# Patient Record
Sex: Female | Born: 1985 | Race: Black or African American | Hispanic: No | Marital: Single | Smoking: Former smoker
Health system: Southern US, Community
[De-identification: ages and names within clinical notes are randomized; demographics above are authoritative.]

## PROBLEM LIST (undated history)

## (undated) HISTORY — PX: NO PAST SURGERIES: SHX2092

---

## 2017-02-27 ENCOUNTER — Encounter (HOSPITAL_COMMUNITY): Payer: Self-pay | Admitting: Emergency Medicine

## 2017-02-27 ENCOUNTER — Emergency Department (HOSPITAL_COMMUNITY)
Admission: EM | Admit: 2017-02-27 | Discharge: 2017-02-27 | Disposition: A | Discharge status: Home / Self Care | Payer: Self-pay | Attending: Emergency Medicine | Admitting: Emergency Medicine

## 2017-02-27 DIAGNOSIS — N750 Cyst of Bartholin's gland: Secondary | ICD-10-CM | POA: Insufficient documentation

## 2017-02-27 DIAGNOSIS — F17200 Nicotine dependence, unspecified, uncomplicated: Secondary | ICD-10-CM | POA: Insufficient documentation

## 2017-02-27 DIAGNOSIS — R102 Pelvic and perineal pain: Secondary | ICD-10-CM | POA: Insufficient documentation

## 2017-02-27 LAB — URINALYSIS, ROUTINE W REFLEX MICROSCOPIC
Bacteria, UA: NONE SEEN
Bilirubin Urine: NEGATIVE
Glucose, UA: NEGATIVE mg/dL
Ketones, ur: NEGATIVE mg/dL
Leukocytes, UA: NEGATIVE
Nitrite: NEGATIVE
Protein, ur: NEGATIVE mg/dL
Specific Gravity, Urine: 1.028 (ref 1.005–1.030)
pH: 6 (ref 5.0–8.0)

## 2017-02-27 LAB — CBC
HCT: 39.2 % (ref 36.0–46.0)
Hemoglobin: 12.8 g/dL (ref 12.0–15.0)
MCH: 31.3 pg (ref 26.0–34.0)
MCHC: 32.7 g/dL (ref 30.0–36.0)
MCV: 95.8 fL (ref 78.0–100.0)
Platelets: 234 10*3/uL (ref 150–400)
RBC: 4.09 MIL/uL (ref 3.87–5.11)
RDW: 13.3 % (ref 11.5–15.5)
WBC: 9.9 10*3/uL (ref 4.0–10.5)

## 2017-02-27 LAB — BASIC METABOLIC PANEL
Anion gap: 7 (ref 5–15)
BUN: 14 mg/dL (ref 6–20)
CO2: 25 mmol/L (ref 22–32)
Calcium: 8.5 mg/dL — ABNORMAL LOW (ref 8.9–10.3)
Chloride: 102 mmol/L (ref 101–111)
Creatinine, Ser: 0.82 mg/dL (ref 0.44–1.00)
GFR calc Af Amer: >60 mL/min (ref >60)
GFR calc non Af Amer: >60 mL/min (ref >60)
Glucose, Bld: 84 mg/dL (ref 65–99)
Potassium: 3.8 mmol/L (ref 3.5–5.1)
Sodium: 134 mmol/L — ABNORMAL LOW (ref 135–145)

## 2017-02-27 LAB — HCG, QUANTITATIVE, PREGNANCY: hCG, Beta Chain, Quant, S: <1 m[IU]/mL (ref <5)

## 2017-02-27 MED ORDER — LIDOCAINE HCL 2 % IJ SOLN
10.0000 mL | Freq: Once | INTRAMUSCULAR | Status: AC
  Administered 2017-02-27: 200 mg
  Filled 2017-02-27: qty 20

## 2017-02-27 NOTE — Discharge Instructions (Signed)
Continue with warm compresses or soaks to promote drainage. Continue tylenol or ibuprofen for pain. Have the area rechecked by an OBGYN in 2 days. You may call to make an appointment with [facility] if you do not have an OBGYN in the area. Return for new or concerning symptoms.

## 2017-02-27 NOTE — ED Provider Notes (Signed)
[name] EMERGENCY DEPARTMENT Provider Note   CSN: [id number] Arrival date & time: 02/27/17  0018     History   Chief Complaint Chief Complaint  Patient presents with  . Cyst    HPI Ariel Sharp is a 31 y.o. female.  31 year old female presents to the emergency department for evaluation of left labial swelling which began yesterday. She states that swelling has been progressive and has not improved despite warm compresses. She reports a history of Bartholin's abscess 2 requiring drainage by an OB/GYN. She has tried Tylenol and Motrin for pain without relief. Pain is aggravated with weightbearing and certain movements. She denies any associated fever. No urinary symptoms or vaginal bleeding/discharge.   The history is provided by the patient. No language interpreter was used.    History reviewed. No pertinent past medical history.  There are no active problems to display for this patient.   History reviewed. No pertinent surgical history.  OB History    No data available       Home Medications    Prior to Admission medications   Not on File    Family History No family history on file.  Social History Social History  Substance Use Topics  . Smoking status: Current Every Day Smoker    Packs/day: 0.50  . Smokeless tobacco: Never Used  . Alcohol use No     Allergies   Shrimp [shellfish allergy]   Review of Systems Review of Systems Ten systems reviewed and are negative for acute change, except as noted in the HPI.    Physical Exam Updated Vital Signs BP (!) 100/59 (BP Location: Right Arm)   Pulse 86   Temp 98 F (36.7 C) (Oral)   Resp 18   Ht 5\' 8"  (1.727 m)   Wt 65.8 kg (145 lb)   LMP 02/13/2017 (Approximate)   SpO2 100%   BMI 22.05 kg/m   Physical Exam  Constitutional: She is oriented to person, place, and time. She appears well-developed and well-nourished. No distress.  Nontoxic and in NAD  HENT:  Head:  Normocephalic and atraumatic.  Eyes: Conjunctivae and EOM are normal. No scleral icterus.  Neck: Normal range of motion.  Pulmonary/Chest: Effort normal. No respiratory distress.  Respirations even and unlabored  Genitourinary:  Genitourinary Comments: Swollen labia minora, left with associated TTP. No distinct fluctuance or induration. There is mild erythema.  Musculoskeletal: Normal range of motion.  Neurological: She is alert and oriented to person, place, and time.  Skin: Skin is warm and dry. No rash noted. She is not diaphoretic. No erythema. No pallor.  Psychiatric: She has a normal mood and affect. Her behavior is normal.  Nursing note and vitals reviewed.    ED Treatments / Results  Labs (all labs ordered are listed, but only abnormal results are displayed) Labs Reviewed  BASIC METABOLIC PANEL - Abnormal; Notable for the following:       Result Value   Sodium 134 (*)    Calcium 8.5 (*)    All other components within normal limits  URINALYSIS, ROUTINE W REFLEX MICROSCOPIC - Abnormal; Notable for the following:    Hgb urine dipstick MODERATE (*)    Squamous Epithelial / LPF 0-5 (*)    All other components within normal limits  CBC  HCG, QUANTITATIVE, PREGNANCY    EKG  EKG Interpretation None       Radiology No results found.  Procedures Procedures (including critical care time)   INCISION AND  DRAINAGE Performed by: Antony Madura Consent: Verbal consent obtained. Risks and benefits: risks, benefits and alternatives were discussed Type: abscess  Body area: L labia  Anesthesia: local infiltration  Incision was made with a scalpel.  Local anesthetic: lidocaine 2% without epinephrine  Anesthetic total: 5 ml  Complexity: complex Blunt dissection to break up loculations  Drainage: mucous and blood  Drainage amount: copious  Packing material: none  Patient tolerance: Patient tolerated the procedure well with no immediate  complications.   Medications Ordered in ED Medications  lidocaine (XYLOCAINE) 2 % (with pres) injection 200 mg (200 mg Infiltration Given 02/27/17 0410)     Initial Impression / Assessment and Plan / ED Course  I have reviewed the triage vital signs and the nursing notes.  Pertinent labs & imaging results that were available during my care of the patient were reviewed by me and considered in my medical decision making (see chart for details).     Patient with bartholin cyst amenable to incision and drainage. Hx of same. Cyst was not large enough to warrant packing or drain. Wound recheck in 2 days advised with the [facility]. Encouraged home warm soaks and flushing. Mild signs of cellulitis is surrounding skin. Will d/c to home. No antibiotic therapy is indicated. Return precautions discussed and provided. Patient discharged in stable condition with no unaddressed concerns.   Final Clinical Impressions(s) / ED Diagnoses   Final diagnoses:  Bartholin's duct cyst    New Prescriptions New Prescriptions   No medications on file     Antony Madura, Cordelia Poche 02/27/17 0445    Zadie Rhine, MD 02/27/17 [phone]

## 2017-02-27 NOTE — ED Triage Notes (Signed)
Pt reports presence of Bartholin's Cyst since yesterday, pt states it is approx size of golfball. Has hx of same, [place] in [place] year for it and ended up having surgery.

## 2017-05-02 ENCOUNTER — Encounter: Payer: Self-pay | Admitting: General Practice

## 2017-05-02 ENCOUNTER — Ambulatory Visit (HOSPITAL_COMMUNITY)
Admission: RE | Admit: 2017-05-02 | Discharge: 2017-05-02 | Disposition: A | Discharge status: Home / Self Care | Payer: Medicaid Other | Source: Ambulatory Visit | Attending: Obstetrics and Gynecology | Admitting: Obstetrics and Gynecology

## 2017-05-02 ENCOUNTER — Telehealth: Payer: Self-pay | Admitting: General Practice

## 2017-05-02 ENCOUNTER — Ambulatory Visit: Payer: Self-pay | Admitting: General Practice

## 2017-05-02 DIAGNOSIS — O3680X0 Pregnancy with inconclusive fetal viability, not applicable or unspecified: Secondary | ICD-10-CM | POA: Insufficient documentation

## 2017-05-02 DIAGNOSIS — Z712 Person consulting for explanation of examination or test findings: Secondary | ICD-10-CM

## 2017-05-02 DIAGNOSIS — T8331XA Breakdown (mechanical) of intrauterine contraceptive device, initial encounter: Secondary | ICD-10-CM | POA: Diagnosis not present

## 2017-05-02 DIAGNOSIS — O9989 Other specified diseases and conditions complicating pregnancy, childbirth and the puerperium: Secondary | ICD-10-CM | POA: Insufficient documentation

## 2017-05-02 DIAGNOSIS — Z3A01 Less than 8 weeks gestation of pregnancy: Secondary | ICD-10-CM | POA: Diagnosis not present

## 2017-05-02 DIAGNOSIS — Z331 Pregnant state, incidental: Secondary | ICD-10-CM

## 2017-05-02 NOTE — Progress Notes (Signed)
Patient here for viability results.  Reviewed ultrasound with Dr Earlene Plateravis & Radiology- no IUD is present. Patient has living IUP & should begin prenatal care. Informed patient of results, dating & provided pictures. Patient verbalized understanding & denies taking any medications or vitamins. Recommended she begin PNV & start OB care. Patient verbalized understanding to all & had no questions

## 2017-05-02 NOTE — Telephone Encounter (Signed)
[facility] today for a positive pregnancy test with LMP of 02/14/17 4953w6d. They performed an ultrasound and saw a gestational sac but no yolk sac or fetal pole. Patient has an IUD in place and denies pain or bleeding. They request we contact the patient for an appt.  Spoke to Dr Earlene Plateravis who recommends ultrasound today with office visit. Ultrasound ordered and scheduled for 10am. Called and informed patient of ultrasound appt today with office visit to follow. Patient verbalized understanding to all & had no questions

## 2017-05-02 NOTE — Progress Notes (Signed)
I have reviewed this chart and agree with the RN/CMA assessment and management.    K. Meryl Davis, M.D. Attending Obstetrician & Gynecologist, [facility], [facility]  

## 2017-05-13 NOTE — L&D Delivery Note (Addendum)
Delivery Note At 4:20 PM a viable female was delivered via Vaginal, Spontaneous (Presentation:OA.  APGAR: 9, 9; weight 6 lb 0.1 oz (2724 g).   Placenta status: complete ,3 vessel Cord  with the following complications: none .  Cord pH: NA  Anesthesia: none Episiotomy: None Lacerations: None Suture Repair: none Est. Blood Loss (mL): 102  Mom to postpartum.  Baby to Couplet care / Skin to Skin.  Sandi Ravelingnson B Alvis 12/04/2017, 5:51 PM  Marcy Sirenatherine Wallace and I were present for the entire delivery of baby and placenta and inspection and agree with above.   Katrinka BlazingSmith, [place] 12/04/2017 8:24 PM

## 2017-06-12 ENCOUNTER — Ambulatory Visit (INDEPENDENT_AMBULATORY_CARE_PROVIDER_SITE_OTHER): Payer: Medicaid Other | Admitting: Obstetrics & Gynecology

## 2017-06-12 ENCOUNTER — Encounter: Payer: Self-pay | Admitting: Obstetrics & Gynecology

## 2017-06-12 ENCOUNTER — Other Ambulatory Visit (HOSPITAL_COMMUNITY)
Admission: RE | Admit: 2017-06-12 | Discharge: 2017-06-12 | Disposition: A | Discharge status: Home / Self Care | Payer: Medicaid Other | Source: Ambulatory Visit | Attending: Obstetrics & Gynecology | Admitting: Obstetrics & Gynecology

## 2017-06-12 DIAGNOSIS — Z3A12 12 weeks gestation of pregnancy: Secondary | ICD-10-CM | POA: Diagnosis not present

## 2017-06-12 DIAGNOSIS — Z23 Encounter for immunization: Secondary | ICD-10-CM | POA: Diagnosis present

## 2017-06-12 DIAGNOSIS — O0991 Supervision of high risk pregnancy, unspecified, first trimester: Secondary | ICD-10-CM | POA: Insufficient documentation

## 2017-06-12 DIAGNOSIS — Z3480 Encounter for supervision of other normal pregnancy, unspecified trimester: Secondary | ICD-10-CM

## 2017-06-12 DIAGNOSIS — Z3482 Encounter for supervision of other normal pregnancy, second trimester: Secondary | ICD-10-CM

## 2017-06-12 DIAGNOSIS — O0990 Supervision of high risk pregnancy, unspecified, unspecified trimester: Secondary | ICD-10-CM

## 2017-06-12 MED ORDER — PROMETHAZINE HCL 25 MG PO TABS
25.0000 mg | ORAL_TABLET | Freq: Four times a day (QID) | ORAL | 2 refills | Status: DC | PRN
Start: 2017-06-12 — End: 2017-12-06

## 2017-06-12 NOTE — Patient Instructions (Addendum)
First Trimester of Pregnancy The first trimester of pregnancy is from week 1 until the end of week 13 (months 1 through 3). A week after a sperm fertilizes an egg, the egg will implant on the wall of the uterus. This embryo will begin to develop into a baby. Genes from you and your partner will form the baby. The female genes will determine whether the baby will be a boy or a girl. At 6-8 weeks, the eyes and face will be formed, and the heartbeat can be seen on ultrasound. At the end of 12 weeks, all the baby's organs will be formed. Now that you are pregnant, you will want to do everything you can to have a healthy baby. Two of the most important things are to get good prenatal care and to follow your health care provider's instructions. Prenatal care is all the medical care you receive before the baby's birth. This care will help prevent, find, and treat any problems during the pregnancy and childbirth. Body changes during your first trimester Your body goes through many changes during pregnancy. The changes vary from woman to woman.  You may gain or lose a couple of pounds at first.  You may feel sick to your stomach (nauseous) and you may throw up (vomit). If the vomiting is uncontrollable, call your health care provider.  You may tire easily.  You may develop headaches that can be relieved by medicines. All medicines should be approved by your health care provider.  You may urinate more often. Painful urination may mean you have a bladder infection.  You may develop heartburn as a result of your pregnancy.  You may develop constipation because certain hormones are causing the muscles that push stool through your intestines to slow down.  You may develop hemorrhoids or swollen veins (varicose veins).  Your breasts may begin to grow larger and become tender. Your nipples may stick out more, and the tissue that surrounds them (areola) may become darker.  Your gums may bleed and may be  sensitive to brushing and flossing.  Dark spots or blotches (chloasma, mask of pregnancy) may develop on your face. This will likely fade after the baby is born.  Your menstrual periods will stop.  You may have a loss of appetite.  You may develop cravings for certain kinds of food.  You may have changes in your emotions from day to day, such as being excited to be pregnant or being concerned that something may go wrong with the pregnancy and baby.  You may have more vivid and strange dreams.  You may have changes in your hair. These can include thickening of your hair, rapid growth, and changes in texture. Some women also have hair loss during or after pregnancy, or hair that feels dry or thin. Your hair will most likely return to normal after your baby is born.  What to expect at prenatal visits During a routine prenatal visit:  You will be weighed to make sure you and the baby are growing normally.  Your blood pressure will be taken.  Your abdomen will be measured to track your baby's growth.  The fetal heartbeat will be listened to between weeks 10 and 14 of your pregnancy.  Test results from any previous visits will be discussed.  Your health care provider may ask you:  How you are feeling.  If you are feeling the baby move.  If you have had any abnormal symptoms, such as leaking fluid, bleeding, severe headaches,   or abdominal cramping.  If you are using any tobacco products, including cigarettes, chewing tobacco, and electronic cigarettes.  If you have any questions.  Other tests that may be performed during your first trimester include:  Blood tests to find your blood type and to check for the presence of any previous infections. The tests will also be used to check for low iron levels (anemia) and protein on red blood cells (Rh antibodies). Depending on your risk factors, or if you previously had diabetes during pregnancy, you may have tests to check for high blood  sugar that affects pregnant women (gestational diabetes).  Urine tests to check for infections, diabetes, or protein in the urine.  An ultrasound to confirm the proper growth and development of the baby.  Fetal screens for spinal cord problems (spina bifida) and Down syndrome.  HIV (human immunodeficiency virus) testing. Routine prenatal testing includes screening for HIV, unless you choose not to have this test.  You may need other tests to make sure you and the baby are doing well.  Follow these instructions at home: Medicines  Follow your health care provider's instructions regarding medicine use. Specific medicines may be either safe or unsafe to take during pregnancy.  Take a prenatal vitamin that contains at least 600 micrograms (mcg) of folic acid.  If you develop constipation, try taking a stool softener if your health care provider approves. Eating and drinking  Eat a balanced diet that includes fresh fruits and vegetables, whole grains, good sources of protein such as meat, eggs, or tofu, and low-fat dairy. Your health care provider will help you determine the amount of weight gain that is right for you.  Avoid raw meat and uncooked cheese. These carry germs that can cause birth defects in the baby.  Eating four or five small meals rather than three large meals a day may help relieve nausea and vomiting. If you start to feel nauseous, eating a few soda crackers can be helpful. Drinking liquids between meals, instead of during meals, also seems to help ease nausea and vomiting.  Limit foods that are high in fat and processed sugars, such as fried and sweet foods.  To prevent constipation: ? Eat foods that are high in fiber, such as fresh fruits and vegetables, whole grains, and beans. ? Drink enough fluid to keep your urine clear or pale yellow. Activity  Exercise only as directed by your health care provider. Most women can continue their usual exercise routine during  pregnancy. Try to exercise for 30 minutes at least 5 days a week. Exercising will help you: ? Control your weight. ? Stay in shape. ? Be prepared for labor and delivery.  Experiencing pain or cramping in the lower abdomen or lower back is a good sign that you should stop exercising. Check with your health care provider before continuing with normal exercises.  Try to avoid standing for long periods of time. Move your legs often if you must stand in one place for a long time.  Avoid heavy lifting.  Wear low-heeled shoes and practice good posture.  You may continue to have sex unless your health care provider tells you not to. Relieving pain and discomfort  Wear a good support bra to relieve breast tenderness.  Take warm sitz baths to soothe any pain or discomfort caused by hemorrhoids. Use hemorrhoid cream if your health care provider approves.  Rest with your legs elevated if you have leg cramps or low back pain.  If you develop   varicose veins in your legs, wear support hose. Elevate your feet for 15 minutes, 3-4 times a day. Limit salt in your diet. Prenatal care  Schedule your prenatal visits by the twelfth week of pregnancy. They are usually scheduled monthly at first, then more often in the last 2 months before delivery.  Write down your questions. Take them to your prenatal visits.  Keep all your prenatal visits as told by your health care provider. This is important. Safety  Wear your seat belt at all times when driving.  Make a list of emergency phone numbers, including numbers for family, friends, the hospital, and police and fire departments. General instructions  Ask your health care provider for a referral to a local prenatal education class. Begin classes no later than the beginning of month 6 of your pregnancy.  Ask for help if you have counseling or nutritional needs during pregnancy. Your health care provider can offer advice or refer you to specialists for help  with various needs.  Do not use hot tubs, steam rooms, or saunas.  Do not douche or use tampons or scented sanitary pads.  Do not cross your legs for long periods of time.  Avoid cat litter boxes and soil used by cats. These carry germs that can cause birth defects in the baby and possibly loss of the fetus by miscarriage or stillbirth.  Avoid all smoking, herbs, alcohol, and medicines not prescribed by your health care provider. Chemicals in these products affect the formation and growth of the baby.  Do not use any products that contain nicotine or tobacco, such as cigarettes and e-cigarettes. If you need help quitting, ask your health care provider. You may receive counseling support and other resources to help you quit.  Schedule a dentist appointment. At home, brush your teeth with a soft toothbrush and be gentle when you floss. Contact a health care provider if:  You have dizziness.  You have mild pelvic cramps, pelvic pressure, or nagging pain in the abdominal area.  You have persistent nausea, vomiting, or diarrhea.  You have a bad smelling vaginal discharge.  You have pain when you urinate.  You notice increased swelling in your face, hands, legs, or ankles.  You are exposed to fifth disease or chickenpox.  You are exposed to German measles (rubella) and have never had it. Get help right away if:  You have a fever.  You are leaking fluid from your vagina.  You have spotting or bleeding from your vagina.  You have severe abdominal cramping or pain.  You have rapid weight gain or loss.  You vomit blood or material that looks like coffee grounds.  You develop a severe headache.  You have shortness of breath.  You have any kind of trauma, such as from a fall or a car accident. Summary  The first trimester of pregnancy is from week 1 until the end of week 13 (months 1 through 3).  Your body goes through many changes during pregnancy. The changes vary from  woman to woman.  You will have routine prenatal visits. During those visits, your health care provider will examine you, discuss any test results you may have, and talk with you about how you are feeling. This information is not intended to replace advice given to you by your health care provider. Make sure you discuss any questions you have with your health care provider. Document Released: 04/23/2001 Document Revised: 04/10/2016 Document Reviewed: 04/10/2016 Elsevier Interactive Patient Education  2018 Elsevier   Inc.  

## 2017-06-12 NOTE — Progress Notes (Signed)
  Subjective:    Ariel Sharp is a G2P0 7138w1d being seen today for her first obstetrical visit.  Her obstetrical history is significant for early prenatal care, dating by 6.2 week US. Patient does intend to breast feed. Pregnancy history fully reviewed.  Patient reports no complaints.except nausea and vomiting   HISTORY: OB History  Gravida Para Term Preterm AB Living  2 1 1  0 0 1  SAB TAB Ectopic Multiple Live Births          1    # Outcome Date GA Lbr Len/2nd Weight Sex Delivery Anes PTL Lv  2 Current           1 Term 01/08/08 7557w0d  8 lb 3 oz (3.714 kg) M Vag-Spont  N LIV    Blood pressure 106/65, pulse 92, weight 139 lb 3.2 oz (63.1 kg), last menstrual period 03/19/2017. Past Surgical History:  Procedure Laterality Date  . NO PAST SURGERIES     Past Medical History:  Diagnosis Date  . Hypertension   . Preterm labor    Allergies  Allergen Reactions  . Shrimp [Shellfish Allergy] Anaphylaxis     Exam    Uterus:   12 week  Pelvic Exam:    Perineum: No Hemorrhoids   Vulva: normal   Vagina:  normal mucosa   pH:     Cervix: no lesions   Adnexa: normal adnexa   Bony Pelvis: average  System: Breast:  normal appearance, no masses or tenderness   Skin: normal coloration and turgor, no rashes    Neurologic: oriented, normal mood   Extremities: normal strength, tone, and muscle mass   HEENT extra ocular movement intact and neck supple with midline trachea   Mouth/Teeth mucous membranes moist, pharynx normal without lesions and dental hygiene good   Neck supple   Cardiovascular: regular rate and rhythm, no murmurs or gallops   Respiratory:  appears well, vitals normal, no respiratory distress, acyanotic, normal RR, neck free of mass or lymphadenopathy, chest clear, no wheezing, crepitations, rhonchi, normal symmetric air entry   Abdomen: soft, non-tender; bowel sounds normal; no masses,  no organomegaly   Urinary: urethral meatus normal      Assessment:    Pregnancy: G2P0 Patient Active Problem List   Diagnosis Date Noted  . Supervision of high risk pregnancy, antepartum 06/12/2017        Plan:     Initial labs drawn. Prenatal vitamins. Problem list reviewed and updated. Genetic Screening discussed Panorama   Ultrasound discussed; fetal survey: 18+ weeks.  Follow up in 4 weeks. 50% of 30 min visit spent on counseling and coordination of care.  Phenergan for nausea   Scheryl DarterJames Arnold 06/12/2017

## 2017-06-12 NOTE — Progress Notes (Signed)
Panorama sent. 

## 2017-06-12 NOTE — Progress Notes (Signed)
Flu shot given in right arm

## 2017-06-13 LAB — CERVICOVAGINAL ANCILLARY ONLY
Chlamydia: NEGATIVE
Neisseria Gonorrhea: NEGATIVE

## 2017-06-14 LAB — URINE CULTURE, OB REFLEX

## 2017-06-14 LAB — CULTURE, OB URINE

## 2017-06-17 ENCOUNTER — Encounter: Payer: Self-pay | Admitting: *Deleted

## 2017-06-20 ENCOUNTER — Encounter: Payer: Self-pay | Admitting: Obstetrics & Gynecology

## 2017-06-20 LAB — SMN1 COPY NUMBER ANALYSIS (SMA CARRIER SCREENING)

## 2017-06-20 LAB — HEMOGLOBINOPATHY EVALUATION
Ferritin: 187 ng/mL — ABNORMAL HIGH (ref 15–150)
Hgb A2 Quant: 2.3 % (ref 1.8–3.2)
Hgb A: 97.7 % (ref 96.4–98.8)
Hgb C: 0 %
Hgb F Quant: 0 % (ref 0.0–2.0)
Hgb S: 0 %
Hgb Solubility: NEGATIVE
Hgb Variant: 0 %

## 2017-06-20 LAB — OBSTETRIC PANEL, INCLUDING HIV
Antibody Screen: NEGATIVE
Basophils Absolute: 0 10*3/uL (ref 0.0–0.2)
Basos: 0 %
EOS (ABSOLUTE): 0.1 10*3/uL (ref 0.0–0.4)
Eos: 1 %
HIV Screen 4th Generation wRfx: NONREACTIVE
Hematocrit: 39.8 % (ref 34.0–46.6)
Hemoglobin: 13.4 g/dL (ref 11.1–15.9)
Hepatitis B Surface Ag: NEGATIVE
Immature Grans (Abs): 0 10*3/uL (ref 0.0–0.1)
Immature Granulocytes: 0 %
Lymphocytes Absolute: 1.7 10*3/uL (ref 0.7–3.1)
Lymphs: 19 %
MCH: 31.1 pg (ref 26.6–33.0)
MCHC: 33.7 g/dL (ref 31.5–35.7)
MCV: 92 fL (ref 79–97)
Monocytes Absolute: 0.4 10*3/uL (ref 0.1–0.9)
Monocytes: 5 %
Neutrophils Absolute: 7 10*3/uL (ref 1.4–7.0)
Neutrophils: 75 %
Platelets: 278 10*3/uL (ref 150–379)
RBC: 4.31 x10E6/uL (ref 3.77–5.28)
RDW: 13 % (ref 12.3–15.4)
RPR Ser Ql: NONREACTIVE
Rh Factor: POSITIVE
Rubella Antibodies, IGG: 1.68 index (ref >0.99)
WBC: 9.3 10*3/uL (ref 3.4–10.8)

## 2017-06-20 LAB — CYSTIC FIBROSIS GENE TEST

## 2017-06-20 MED ORDER — PRENATAL VITAMINS 0.8 MG PO TABS: 1.0000 | ORAL_TABLET | Freq: Every day | ORAL | 12 refills | Status: DC

## 2017-06-23 ENCOUNTER — Telehealth: Payer: Self-pay | Admitting: *Deleted

## 2017-06-23 NOTE — Telephone Encounter (Signed)
Received a voicemail from [employer] re: needed information to process panorama.  I called [employer] and provided missing information- needed to clarify office contact and fax information.

## 2017-06-24 ENCOUNTER — Other Ambulatory Visit: Payer: Self-pay | Admitting: General Practice

## 2017-06-24 ENCOUNTER — Encounter: Payer: Self-pay | Admitting: *Deleted

## 2017-06-24 DIAGNOSIS — Z331 Pregnant state, incidental: Secondary | ICD-10-CM

## 2017-06-24 MED ORDER — PRENATAL PLUS 27-1 MG PO TABS: 1.0000 | ORAL_TABLET | Freq: Every day | ORAL | 12 refills | Status: DC

## 2017-07-09 ENCOUNTER — Ambulatory Visit (INDEPENDENT_AMBULATORY_CARE_PROVIDER_SITE_OTHER): Payer: Medicaid Other | Admitting: Obstetrics and Gynecology

## 2017-07-09 VITALS — BP 102/59 | HR 93 | Wt 142.7 lb

## 2017-07-09 DIAGNOSIS — Z3689 Encounter for other specified antenatal screening: Secondary | ICD-10-CM

## 2017-07-09 NOTE — Progress Notes (Signed)
   PRENATAL VISIT NOTE  Subjective:  Ariel Sharp is a 32 y.o. G2P1001 at 429w0d being seen today for ongoing prenatal care.  She is currently monitored for the following issues for this low-risk pregnancy and has Supervision of other normal pregnancy, antepartum on their problem list.  Patient reports no complaints. She states her nausea and vomiting is "pretty much gone". She is able to eat and drink. Vag. Bleeding: None.  Movement: Present. Denies leaking of fluid.   The following portions of the patient's history were reviewed and updated as appropriate: allergies, current medications, past family history, past medical history, past social history, past surgical history and problem list. Problem list updated.  Objective:   Vitals:   07/09/17 1045  BP: (!) 102/59  Pulse: 93  Weight: 142 lb 11.2 oz (64.7 kg)    Fetal Status: Fetal Heart Rate (bpm): 51   Movement: Present     General:  Alert, oriented and cooperative. Patient is in no acute distress.  Skin: Skin is warm and dry. No rash noted.   Cardiovascular: Normal heart rate noted  Respiratory: Normal respiratory effort, no problems with respiration noted  Abdomen: Soft, gravid, appropriate for gestational age.  Pain/Pressure: Absent     Pelvic: Cervical exam deferred        Extremities: Normal range of motion.  Edema: None  Mental Status:  Normal mood and affect. Normal behavior. Normal judgment and thought content.   Assessment and Plan:  Pregnancy: G2P1001 at 129w0d  1. Screening, antenatal, for fetal anatomic survey - US MFM OB COMP + 14 WK; Future - 07/30/2017  Preterm labor symptoms and general obstetric precautions including but not limited to vaginal bleeding, contractions, leaking of fluid and fetal movement were reviewed in detail with the patient. Please refer to After Visit Summary for other counseling recommendations.  Return in about 4 weeks (around 08/06/2017) for Return OB visit.   Raelyn Moraolitta Sabena Winner, CNM

## 2017-07-23 ENCOUNTER — Encounter (HOSPITAL_COMMUNITY): Payer: Self-pay | Admitting: Obstetrics and Gynecology

## 2017-07-30 ENCOUNTER — Ambulatory Visit (HOSPITAL_COMMUNITY)
Admission: RE | Admit: 2017-07-30 | Discharge: 2017-07-30 | Disposition: A | Discharge status: Home / Self Care | Payer: Medicaid Other | Source: Ambulatory Visit | Attending: Obstetrics and Gynecology | Admitting: Obstetrics and Gynecology

## 2017-07-30 DIAGNOSIS — Z3689 Encounter for other specified antenatal screening: Secondary | ICD-10-CM | POA: Diagnosis present

## 2017-07-30 DIAGNOSIS — O4422 Partial placenta previa NOS or without hemorrhage, second trimester: Secondary | ICD-10-CM | POA: Diagnosis not present

## 2017-07-30 DIAGNOSIS — O322XX0 Maternal care for transverse and oblique lie, not applicable or unspecified: Secondary | ICD-10-CM | POA: Diagnosis not present

## 2017-07-30 DIAGNOSIS — Z3A19 19 weeks gestation of pregnancy: Secondary | ICD-10-CM | POA: Insufficient documentation

## 2017-08-06 ENCOUNTER — Encounter: Payer: Self-pay | Admitting: Medical

## 2017-08-06 ENCOUNTER — Ambulatory Visit (INDEPENDENT_AMBULATORY_CARE_PROVIDER_SITE_OTHER): Payer: Medicaid Other | Admitting: Medical

## 2017-08-06 VITALS — BP 107/62 | HR 76 | Wt 146.3 lb

## 2017-08-06 DIAGNOSIS — Z3482 Encounter for supervision of other normal pregnancy, second trimester: Secondary | ICD-10-CM

## 2017-08-06 DIAGNOSIS — Z3480 Encounter for supervision of other normal pregnancy, unspecified trimester: Secondary | ICD-10-CM

## 2017-08-06 DIAGNOSIS — O4453 Low lying placenta with hemorrhage, third trimester: Secondary | ICD-10-CM | POA: Insufficient documentation

## 2017-08-06 DIAGNOSIS — O4402 Placenta previa specified as without hemorrhage, second trimester: Secondary | ICD-10-CM

## 2017-08-06 NOTE — Patient Instructions (Addendum)
Second Trimester of Pregnancy The second trimester is from week 13 through week 28, month 4 through 6. This is often the time in pregnancy that you feel your best. Often times, morning sickness has lessened or quit. You may have more energy, and you may get hungry more often. Your unborn baby (fetus) is growing rapidly. At the end of the sixth month, he or she is about 9 inches long and weighs about 1 pounds. You will likely feel the baby move (quickening) between 18 and 20 weeks of pregnancy. Follow these instructions at home:  Avoid all smoking, herbs, and alcohol. Avoid drugs not approved by your doctor.  Do not use any tobacco products, including cigarettes, chewing tobacco, and electronic cigarettes. If you need help quitting, ask your doctor. You may get counseling or other support to help you quit.  Only take medicine as told by your doctor. Some medicines are safe and some are not during pregnancy.  Exercise only as told by your doctor. Stop exercising if you start having cramps.  Eat regular, healthy meals.  Wear a good support bra if your breasts are tender.  Do not use hot tubs, steam rooms, or saunas.  Wear your seat belt when driving.  Avoid raw meat, uncooked cheese, and liter boxes and soil used by cats.  Take your prenatal vitamins.  Take 1500-2000 milligrams of calcium daily starting at the 20th week of pregnancy until you deliver your baby.  Try taking medicine that helps you poop (stool softener) as needed, and if your doctor approves. Eat more fiber by eating fresh fruit, vegetables, and whole grains. Drink enough fluids to keep your pee (urine) clear or pale yellow.  Take warm water baths (sitz baths) to soothe pain or discomfort caused by hemorrhoids. Use hemorrhoid cream if your doctor approves.  If you have puffy, bulging veins (varicose veins), wear support hose. Raise (elevate) your feet for 15 minutes, 3-4 times a day. Limit salt in your diet.  Avoid heavy  lifting, wear low heals, and sit up straight.  Rest with your legs raised if you have leg cramps or low back pain.  Visit your dentist if you have not gone during your pregnancy. Use a soft toothbrush to brush your teeth. Be gentle when you floss.  You can have sex (intercourse) unless your doctor tells you not to.  Go to your doctor visits. Get help if:  You feel dizzy.  You have mild cramps or pressure in your lower belly (abdomen).  You have a nagging pain in your belly area.  You continue to feel sick to your stomach (nauseous), throw up (vomit), or have watery poop (diarrhea).  You have bad smelling fluid coming from your vagina.  You have pain with peeing (urination). Get help right away if:  You have a fever.  You are leaking fluid from your vagina.  You have spotting or bleeding from your vagina.  You have severe belly cramping or pain.  You lose or gain weight rapidly.  You have trouble catching your breath and have chest pain.  You notice sudden or extreme puffiness (swelling) of your face, hands, ankles, feet, or legs.  You have not felt the baby move in over an hour.  You have severe headaches that do not go away with medicine.  You have vision changes. This information is not intended to replace advice given to you by your health care provider. Make sure you discuss any questions you have with your health care   provider. Document Released: 07/24/2009 Document Revised: 10/05/2015 Document Reviewed: 06/30/2012 Elsevier Interactive Patient Education  2017 [employer]    Placenta Previa Placenta previa is a condition in which the placenta implants in the lower part of the uterus in pregnant women. The placenta either partially or completely covers the opening to the cervix. This is a problem because the baby must pass through the cervix during delivery. There are three types of placenta previa:  Marginal placenta previa. The placenta reaches within an  inch (2.5 cm) of the cervical opening but does not cover it.  Partial placenta previa. The placenta covers part of the cervical opening.  Complete placenta previa. The placenta covers the entire cervical opening.  If the previa is marginal or partial and it is diagnosed in the first half of pregnancy, the placenta may move into a normal position as the pregnancy progresses and may no longer cover the cervix. It is important to keep all prenatal visits with your health care provider so you can be more closely monitored. What are the causes? The cause of this condition is not known. What increases the risk? This condition is more likely to develop in women who:  Are carrying more than one baby (multiples).  Have an abnormally shaped uterus.  Have scars on the lining of the uterus.  Have had surgeries involving the uterus, such as a cesarean delivery.  Have delivered a baby before.  Have a history of placenta previa.  Have smoked or used cocaine during pregnancy.  Are age 38 or older during pregnancy.  What are the signs or symptoms? The main symptom of this condition is sudden, painless vaginal bleeding during the second half of pregnancy. The amount of bleeding can be very light at first, and it usually stops on its own. Heavier bleeding episodes may also happen. Some women with placenta previa may have no bleeding at all. How is this diagnosed?  This condition is diagnosed: ? From an ultrasound. This test uses sound waves to find where the placenta is located before you have any bleeding episodes. ? During a checkup after vaginal bleeding is noticed.  If you are diagnosed with a partial or complete previa, digital exams with fingers will generally be avoided. Your health care provider will still perform a speculum exam.  If you did not have an ultrasound during your pregnancy, placenta previa may not be diagnosed until bleeding occurs during labor. How is this  treated? Treatment for this condition may include:  Decreased activity.  Bed rest at home or in the hospital.  Pelvic rest. Nothing is placed inside the vagina during pelvic rest. This means not having sex and not using tampons or douches.  A blood transfusion to replace blood that you have lost (maternal blood loss).  A cesarean delivery. This may be performed if: ? The bleeding is heavy and cannot be controlled. ? The placenta completely covers the cervix.  Medicines to stop premature labor or to help the baby's lungs to mature. This treatment may be used if you need delivery before your pregnancy is full-term.  Your treatment will be decided based on:  How much you are bleeding, or whether the bleeding has stopped.  How far along you are in your pregnancy.  The condition of your baby.  The type of placenta previa that you have.  Follow these instructions at home:  Get plenty of rest and lessen activity as told by your health care provider.  Stay on bed rest  for as long as told by your health care provider.  Do not have sex, use tampons, use a douche, or place anything inside of your vagina if your health care provider recommended pelvic rest.  Take over-the-counter and prescription medicines as told by your health care provider.  Keep all follow-up visits as told by your health care provider. This is important. Get help right away if:  You have vaginal bleeding, even if in small amounts and even if you have no pain.  You have cramping or regular contractions.  You have pain in your abdomen or your lower back.  You have a feeling of increased pressure in your pelvis.  You have increased watery or bloody mucus from the vagina. This information is not intended to replace advice given to you by your health care provider. Make sure you discuss any questions you have with your health care provider. Document Released: 04/29/2005 Document Revised: 01/17/2016 Document  Reviewed: 11/11/2015 Elsevier Interactive Patient Education  [employer]2018 [employer] Inc.

## 2017-08-06 NOTE — Progress Notes (Signed)
   PRENATAL VISIT NOTE  Subjective:  Ariel Sharp is a 32 y.o. G2P1001 at 759w0d being seen today for ongoing prenatal care.  She is currently monitored for the following issues for this high-risk pregnancy and has Supervision of other normal pregnancy, antepartum and Placenta previa antepartum in second trimester on their problem list.  Patient reports no complaints.  Contractions: Not present. Vag. Bleeding: None.  Movement: Present. Denies leaking of fluid.   The following portions of the patient's history were reviewed and updated as appropriate: allergies, current medications, past family history, past medical history, past social history, past surgical history and problem list. Problem list updated.  Objective:   Vitals:   08/06/17 0950  BP: 107/62  Pulse: 76  Weight: 146 lb 4.8 oz (66.4 kg)    Fetal Status: Fetal Heart Rate (bpm): 145 Fundal Height: 20 cm Movement: Present     General:  Alert, oriented and cooperative. Patient is in no acute distress.  Skin: Skin is warm and dry. No rash noted.   Cardiovascular: Normal heart rate noted  Respiratory: Normal respiratory effort, no problems with respiration noted  Abdomen: Soft, gravid, appropriate for gestational age.  Pain/Pressure: Absent     Pelvic: Cervical exam deferred        Extremities: Normal range of motion.  Edema: None  Mental Status:  Normal mood and affect. Normal behavior. Normal judgment and thought content.   Assessment and Plan:  Pregnancy: G2P1001 at 6359w0d  1. Supervision of other normal pregnancy, antepartum  2. Placenta previa antepartum in second trimester - Noted on anatomy US. No bleeding today - US MFM OB FOLLOW UP; scheduled 09/10/17 to complete anatomy and re-assess placenta - Strict pelvic rest and bleeding precautions discussed   Preterm labor symptoms and general obstetric precautions including but not limited to vaginal bleeding, contractions, leaking of fluid and fetal movement were  reviewed in detail with the patient. Please refer to After Visit Summary for other counseling recommendations.  Return in about 1 month (around 09/03/2017) for LOB.   Vonzella NippleJulie Wenzel, PA-C

## 2017-09-03 ENCOUNTER — Ambulatory Visit (INDEPENDENT_AMBULATORY_CARE_PROVIDER_SITE_OTHER): Payer: Medicaid Other | Admitting: Obstetrics and Gynecology

## 2017-09-03 VITALS — BP 99/57 | HR 83 | Wt 152.0 lb

## 2017-09-03 DIAGNOSIS — R7611 Nonspecific reaction to tuberculin skin test without active tuberculosis: Secondary | ICD-10-CM | POA: Insufficient documentation

## 2017-09-03 DIAGNOSIS — Z3480 Encounter for supervision of other normal pregnancy, unspecified trimester: Secondary | ICD-10-CM

## 2017-09-03 DIAGNOSIS — O4402 Placenta previa specified as without hemorrhage, second trimester: Secondary | ICD-10-CM

## 2017-09-03 NOTE — Progress Notes (Signed)
   PRENATAL VISIT NOTE  Subjective:  Ariel Sharp is a 32 y.o. G2P1001 at 232w0d being seen today for ongoing prenatal care.  She is currently monitored for the following issues for this high-risk pregnancy and has Supervision of other normal pregnancy, antepartum; Placenta previa antepartum in second trimester; and Positive PPD on their problem list.  Patient reports no complaints.  Contractions: Not present. Vag. Bleeding: None.  Movement: Present. Denies leaking of fluid.   The following portions of the patient's history were reviewed and updated as appropriate: allergies, current medications, past family history, past medical history, past social history, past surgical history and problem list. Problem list updated.  Objective:   Vitals:   09/03/17 0945  BP: (!) 99/57  Pulse: 83  Weight: 152 lb (68.9 kg)    Fetal Status: Fetal Heart Rate (bpm): 145   Movement: Present     General:  Alert, oriented and cooperative. Patient is in no acute distress.  Skin: Skin is warm and dry. No rash noted.   Cardiovascular: Normal heart rate noted  Respiratory: Normal respiratory effort, no problems with respiration noted  Abdomen: Soft, gravid, appropriate for gestational age.  Pain/Pressure: Present     Pelvic: Cervical exam deferred        Extremities: Normal range of motion.  Edema: None  Mental Status: Normal mood and affect. Normal behavior. Normal judgment and thought content.   Assessment and Plan:  Pregnancy: G2P1001 at 612w0d  1. Supervision of other normal pregnancy, antepartum  2. Placenta previa antepartum in second trimester Posterior previa Next US scheduled for 5/1  3. Positive PPD Tested positive at work [facility] No symptoms No h/o known exposure - QuantiFERON-TB Gold Plus   Preterm labor symptoms and general obstetric precautions including but not limited to vaginal bleeding, contractions, leaking of fluid and fetal  movement were reviewed in detail with the patient. Please refer to After Visit Summary for other counseling recommendations.  Return in about 1 month (around 10/01/2017) for OB visit, 2 hr GTT, 3rd trim labs.  Future Appointments  Date Time Provider Department Center  09/10/2017  8:30 AM [facility] US 1 [facility]    Conan BowensKelly M Davis, MD

## 2017-09-08 LAB — QUANTIFERON-TB GOLD PLUS
QuantiFERON Mitogen Value: >10 IU/mL
QuantiFERON Nil Value: 0.03 IU/mL
QuantiFERON TB1 Ag Value: 0.15 IU/mL
QuantiFERON TB2 Ag Value: 0.16 IU/mL
QuantiFERON-TB Gold Plus: NEGATIVE

## 2017-09-10 ENCOUNTER — Other Ambulatory Visit (HOSPITAL_COMMUNITY): Payer: Self-pay | Admitting: *Deleted

## 2017-09-10 ENCOUNTER — Other Ambulatory Visit: Payer: Self-pay | Admitting: Medical

## 2017-09-10 ENCOUNTER — Ambulatory Visit (HOSPITAL_COMMUNITY)
Admission: RE | Admit: 2017-09-10 | Discharge: 2017-09-10 | Disposition: A | Discharge status: Home / Self Care | Payer: Medicaid Other | Source: Ambulatory Visit | Attending: Medical | Admitting: Medical

## 2017-09-10 ENCOUNTER — Encounter (HOSPITAL_COMMUNITY): Payer: Self-pay

## 2017-09-10 DIAGNOSIS — Z3A25 25 weeks gestation of pregnancy: Secondary | ICD-10-CM | POA: Diagnosis not present

## 2017-09-10 DIAGNOSIS — O4402 Placenta previa specified as without hemorrhage, second trimester: Secondary | ICD-10-CM

## 2017-09-10 DIAGNOSIS — O4443 Low lying placenta NOS or without hemorrhage, third trimester: Secondary | ICD-10-CM

## 2017-09-10 DIAGNOSIS — Z362 Encounter for other antenatal screening follow-up: Secondary | ICD-10-CM | POA: Insufficient documentation

## 2017-09-29 ENCOUNTER — Other Ambulatory Visit: Payer: Self-pay | Admitting: *Deleted

## 2017-09-29 DIAGNOSIS — Z3480 Encounter for supervision of other normal pregnancy, unspecified trimester: Secondary | ICD-10-CM

## 2017-10-01 ENCOUNTER — Ambulatory Visit (INDEPENDENT_AMBULATORY_CARE_PROVIDER_SITE_OTHER): Payer: Medicaid Other | Admitting: Advanced Practice Midwife

## 2017-10-01 ENCOUNTER — Other Ambulatory Visit: Payer: Medicaid Other

## 2017-10-01 VITALS — BP 98/64 | HR 86 | Wt 152.8 lb

## 2017-10-01 DIAGNOSIS — Z3482 Encounter for supervision of other normal pregnancy, second trimester: Secondary | ICD-10-CM

## 2017-10-01 DIAGNOSIS — Z3480 Encounter for supervision of other normal pregnancy, unspecified trimester: Secondary | ICD-10-CM

## 2017-10-01 DIAGNOSIS — O4402 Placenta previa specified as without hemorrhage, second trimester: Secondary | ICD-10-CM

## 2017-10-01 NOTE — Progress Notes (Signed)
   PRENATAL VISIT NOTE  Subjective:  Ariel Sharp is a 32 y.o. G2P1001 at 101w0d being seen today for ongoing prenatal care.  She is currently monitored for the following issues for this low-risk pregnancy and has Supervision of other normal pregnancy, antepartum; Placenta previa antepartum in second trimester; and Positive PPD on their problem list.  Patient reports no complaints.  Contractions: Not present. Vag. Bleeding: None.  Movement: Present. Denies leaking of fluid.   The following portions of the patient's history were reviewed and updated as appropriate: allergies, current medications, past family history, past medical history, past social history, past surgical history and problem list. Problem list updated.  Objective:   Vitals:   10/01/17 1039  BP: 98/64  Pulse: 86  Weight: 152 lb 12.8 oz (69.3 kg)    Fetal Status: Fetal Heart Rate (bpm): 150 Fundal Height: 28 cm Movement: Present     General:  Alert, oriented and cooperative. Patient is in no acute distress.  Skin: Skin is warm and dry. No rash noted.   Cardiovascular: Normal heart rate noted  Respiratory: Normal respiratory effort, no problems with respiration noted  Abdomen: Soft, gravid, appropriate for gestational age.  Pain/Pressure: Present     Pelvic: Cervical exam deferred        Extremities: Normal range of motion.  Edema: Trace  Mental Status: Normal mood and affect. Normal behavior. Normal judgment and thought content.   Assessment and Plan:  Pregnancy: G2P1001 at [redacted]w[redacted]d  1. Supervision of other normal pregnancy, antepartum --anticipatory guidance about next weeks of pregnancy, next office visits. --28 week labs today with 2 hour GTT, CBC, HIV, and RPR  2. Placenta previa antepartum in second trimester --Now low-lying, has f/u US on 10/22/17 to evaluate.  Preterm labor symptoms and general obstetric precautions including but not limited to vaginal bleeding, contractions, leaking of fluid and fetal  movement were reviewed in detail with the patient. Please refer to After Visit Summary for other counseling recommendations.  Return in about 2 weeks (around 10/15/2017).  Future Appointments  Date Time Provider Department Center  10/15/2017  9:15 AM Judeth Horn, NP [facility]  10/22/2017  8:30 AM [facility] [place] 1 [facility]  10/29/2017  9:55 AM Judeth Horn, NP [facility]    Sharen Counter, CNM

## 2017-10-01 NOTE — Patient Instructions (Signed)
Third Trimester of Pregnancy The third trimester is from week 28 through week 40 (months 7 through 9). The third trimester is a time when the unborn baby (fetus) is growing rapidly. At the end of the ninth month, the fetus is about 20 inches in length and weighs 6-10 pounds. Body changes during your third trimester Your body will continue to go through many changes during pregnancy. The changes vary from woman to woman. During the third trimester:  Your weight will continue to increase. You can expect to gain 25-35 pounds (11-16 kg) by the end of the pregnancy.  You may begin to get stretch marks on your hips, abdomen, and breasts.  You may urinate more often because the fetus is moving lower into your pelvis and pressing on your bladder.  You may develop or continue to have heartburn. This is caused by increased hormones that slow down muscles in the digestive tract.  You may develop or continue to have constipation because increased hormones slow digestion and cause the muscles that push waste through your intestines to relax.  You may develop hemorrhoids. These are swollen veins (varicose veins) in the rectum that can itch or be painful.  You may develop swollen, bulging veins (varicose veins) in your legs.  You may have increased body aches in the pelvis, back, or thighs. This is due to weight gain and increased hormones that are relaxing your joints.  You may have changes in your hair. These can include thickening of your hair, rapid growth, and changes in texture. Some women also have hair loss during or after pregnancy, or hair that feels dry or thin. Your hair will most likely return to normal after your baby is born.  Your breasts will continue to grow and they will continue to become tender. A yellow fluid (colostrum) may leak from your breasts. This is the first milk you are producing for your baby.  Your belly button may stick out.  You may notice more swelling in your hands,  face, or ankles.  You may have increased tingling or numbness in your hands, arms, and legs. The skin on your belly may also feel numb.  You may feel short of breath because of your expanding uterus.  You may have more problems sleeping. This can be caused by the size of your belly, increased need to urinate, and an increase in your body's metabolism.  You may notice the fetus "dropping," or moving lower in your abdomen (lightening).  You may have increased vaginal discharge.  You may notice your joints feel loose and you may have pain around your pelvic bone.  What to expect at prenatal visits You will have prenatal exams every 2 weeks until week 36. Then you will have weekly prenatal exams. During a routine prenatal visit:  You will be weighed to make sure you and the baby are growing normally.  Your blood pressure will be taken.  Your abdomen will be measured to track your baby's growth.  The fetal heartbeat will be listened to.  Any test results from the previous visit will be discussed.  You may have a cervical check near your due date to see if your cervix has softened or thinned (effaced).  You will be tested for Group B streptococcus. This happens between 35 and 37 weeks.  Your health care provider may ask you:  What your birth plan is.  How you are feeling.  If you are feeling the baby move.  If you have had   any abnormal symptoms, such as leaking fluid, bleeding, severe headaches, or abdominal cramping.  If you are using any tobacco products, including cigarettes, chewing tobacco, and electronic cigarettes.  If you have any questions.  Other tests or screenings that may be performed during your third trimester include:  Blood tests that check for low iron levels (anemia).  Fetal testing to check the health, activity level, and growth of the fetus. Testing is done if you have certain medical conditions or if there are problems during the  pregnancy.  Nonstress test (NST). This test checks the health of your baby to make sure there are no signs of problems, such as the baby not getting enough oxygen. During this test, a belt is placed around your belly. The baby is made to move, and its heart rate is monitored during movement.  What is false labor? False labor is a condition in which you feel small, irregular tightenings of the muscles in the womb (contractions) that usually go away with rest, changing position, or drinking water. These are called Braxton Hicks contractions. Contractions may last for hours, days, or even weeks before true labor sets in. If contractions come at regular intervals, become more frequent, increase in intensity, or become painful, you should see your health care provider. What are the signs of labor?  Abdominal cramps.  Regular contractions that start at 10 minutes apart and become stronger and more frequent with time.  Contractions that start on the top of the uterus and spread down to the lower abdomen and back.  Increased pelvic pressure and dull back pain.  A watery or bloody mucus discharge that comes from the vagina.  Leaking of amniotic fluid. This is also known as your "water breaking." It could be a slow trickle or a gush. Let your health care provider know if it has a color or strange odor. If you have any of these signs, call your health care provider right away, even if it is before your due date. Follow these instructions at home: Medicines  Follow your health care provider's instructions regarding medicine use. Specific medicines may be either safe or unsafe to take during pregnancy.  Take a prenatal vitamin that contains at least 600 micrograms (mcg) of folic acid.  If you develop constipation, try taking a stool softener if your health care provider approves. Eating and drinking  Eat a balanced diet that includes fresh fruits and vegetables, whole grains, good sources of protein  such as meat, eggs, or tofu, and low-fat dairy. Your health care provider will help you determine the amount of weight gain that is right for you.  Avoid raw meat and uncooked cheese. These carry germs that can cause birth defects in the baby.  If you have low calcium intake from food, talk to your health care provider about whether you should take a daily calcium supplement.  Eat four or five small meals rather than three large meals a day.  Limit foods that are high in fat and processed sugars, such as fried and sweet foods.  To prevent constipation: ? Drink enough fluid to keep your urine clear or pale yellow. ? Eat foods that are high in fiber, such as fresh fruits and vegetables, whole grains, and beans. Activity  Exercise only as directed by your health care provider. Most women can continue their usual exercise routine during pregnancy. Try to exercise for 30 minutes at least 5 days a week. Stop exercising if you experience uterine contractions.  Avoid heavy   lifting.  Do not exercise in extreme heat or humidity, or at high altitudes.  Wear low-heel, comfortable shoes.  Practice good posture.  You may continue to have sex unless your health care provider tells you otherwise. Relieving pain and discomfort  Take frequent breaks and rest with your legs elevated if you have leg cramps or low back pain.  Take warm sitz baths to soothe any pain or discomfort caused by hemorrhoids. Use hemorrhoid cream if your health care provider approves.  Wear a good support bra to prevent discomfort from breast tenderness.  If you develop varicose veins: ? Wear support pantyhose or compression stockings as told by your healthcare provider. ? Elevate your feet for 15 minutes, 3-4 times a day. Prenatal care  Write down your questions. Take them to your prenatal visits.  Keep all your prenatal visits as told by your health care provider. This is important. Safety  Wear your seat belt at  all times when driving.  Make a list of emergency phone numbers, including numbers for family, friends, the hospital, and police and fire departments. General instructions  Avoid cat litter boxes and soil used by cats. These carry germs that can cause birth defects in the baby. If you have a cat, ask someone to clean the litter box for you.  Do not travel far distances unless it is absolutely necessary and only with the approval of your health care provider.  Do not use hot tubs, steam rooms, or saunas.  Do not drink alcohol.  Do not use any products that contain nicotine or tobacco, such as cigarettes and e-cigarettes. If you need help quitting, ask your health care provider.  Do not use any medicinal herbs or unprescribed drugs. These chemicals affect the formation and growth of the baby.  Do not douche or use tampons or scented sanitary pads.  Do not cross your legs for long periods of time.  To prepare for the arrival of your baby: ? Take prenatal classes to understand, practice, and ask questions about labor and delivery. ? Make a trial run to the hospital. ? Visit the hospital and tour the maternity area. ? Arrange for maternity or paternity leave through employers. ? Arrange for family and friends to take care of pets while you are in the hospital. ? Purchase a rear-facing car seat and make sure you know how to install it in your car. ? Pack your hospital bag. ? Prepare the baby's nursery. Make sure to remove all pillows and stuffed animals from the baby's crib to prevent suffocation.  Visit your dentist if you have not gone during your pregnancy. Use a soft toothbrush to brush your teeth and be gentle when you floss. Contact a health care provider if:  You are unsure if you are in labor or if your water has broken.  You become dizzy.  You have mild pelvic cramps, pelvic pressure, or nagging pain in your abdominal area.  You have lower back pain.  You have persistent  nausea, vomiting, or diarrhea.  You have an unusual or bad smelling vaginal discharge.  You have pain when you urinate. Get help right away if:  Your water breaks before 37 weeks.  You have regular contractions less than 5 minutes apart before 37 weeks.  You have a fever.  You are leaking fluid from your vagina.  You have spotting or bleeding from your vagina.  You have severe abdominal pain or cramping.  You have rapid weight loss or weight gain.    You have shortness of breath with chest pain.  You notice sudden or extreme swelling of your face, hands, ankles, feet, or legs.  Your baby makes fewer than 10 movements in 2 hours.  You have severe headaches that do not go away when you take medicine.  You have vision changes. Summary  The third trimester is from week 28 through week 40, months 7 through 9. The third trimester is a time when the unborn baby (fetus) is growing rapidly.  During the third trimester, your discomfort may increase as you and your baby continue to gain weight. You may have abdominal, leg, and back pain, sleeping problems, and an increased need to urinate.  During the third trimester your breasts will keep growing and they will continue to become tender. A yellow fluid (colostrum) may leak from your breasts. This is the first milk you are producing for your baby.  False labor is a condition in which you feel small, irregular tightenings of the muscles in the womb (contractions) that eventually go away. These are called Braxton Hicks contractions. Contractions may last for hours, days, or even weeks before true labor sets in.  Signs of labor can include: abdominal cramps; regular contractions that start at 10 minutes apart and become stronger and more frequent with time; watery or bloody mucus discharge that comes from the vagina; increased pelvic pressure and dull back pain; and leaking of amniotic fluid. This information is not intended to replace advice  given to you by your health care provider. Make sure you discuss any questions you have with your health care provider. Document Released: 04/23/2001 Document Revised: 10/05/2015 Document Reviewed: 06/30/2012 Elsevier Interactive Patient Education  2017 [employer]  

## 2017-10-02 ENCOUNTER — Encounter: Payer: Self-pay | Admitting: Medical

## 2017-10-02 ENCOUNTER — Telehealth: Payer: Self-pay | Admitting: *Deleted

## 2017-10-02 DIAGNOSIS — O24419 Gestational diabetes mellitus in pregnancy, unspecified control: Secondary | ICD-10-CM | POA: Insufficient documentation

## 2017-10-02 LAB — CBC
Hematocrit: 33.9 % — ABNORMAL LOW (ref 34.0–46.6)
Hemoglobin: 11.2 g/dL (ref 11.1–15.9)
MCH: 31.6 pg (ref 26.6–33.0)
MCHC: 33 g/dL (ref 31.5–35.7)
MCV: 96 fL (ref 79–97)
Platelets: 233 10*3/uL (ref 150–450)
RBC: 3.54 x10E6/uL — ABNORMAL LOW (ref 3.77–5.28)
RDW: 13.7 % (ref 12.3–15.4)
WBC: 11.4 10*3/uL — ABNORMAL HIGH (ref 3.4–10.8)

## 2017-10-02 LAB — GLUCOSE TOLERANCE, 2 HOURS W/ 1HR
Glucose, 1 hour: 197 mg/dL — ABNORMAL HIGH (ref 65–179)
Glucose, 2 hour: 143 mg/dL (ref 65–152)
Glucose, Fasting: 94 mg/dL — ABNORMAL HIGH (ref 65–91)

## 2017-10-02 LAB — RPR: RPR Ser Ql: NONREACTIVE

## 2017-10-02 LAB — HIV ANTIBODY (ROUTINE TESTING W REFLEX): HIV Screen 4th Generation wRfx: NONREACTIVE

## 2017-10-02 NOTE — Telephone Encounter (Signed)
-----   Message from Marny Lowenstein, PA-C sent at 10/02/2017  2:10 PM EDT ----- Patient failed 2 hour GTT. Please set up with diabetes education, supplies and babyscripts. Make sure next visit is with MD and inform patient.   Thanks,  Raynelle Fanning

## 2017-10-02 NOTE — Telephone Encounter (Signed)
I called Trey and notified her she has failed her GDM screen has GDM and that we have scheduled her to come in and see our nutritionist for education re: GDM. I gave her that appt and her next ob appt. She voices understanding.

## 2017-10-09 ENCOUNTER — Other Ambulatory Visit: Payer: Self-pay | Admitting: General Practice

## 2017-10-09 ENCOUNTER — Encounter: Payer: Self-pay | Admitting: General Practice

## 2017-10-09 DIAGNOSIS — O24410 Gestational diabetes mellitus in pregnancy, diet controlled: Secondary | ICD-10-CM

## 2017-10-09 MED ORDER — GLUCOSE BLOOD VI STRP: ORAL_STRIP | 12 refills | Status: DC

## 2017-10-09 MED ORDER — ACCU-CHEK FASTCLIX LANCETS MISC: 1.0000 | Freq: Four times a day (QID) | 12 refills | Status: DC

## 2017-10-09 MED ORDER — ACCU-CHEK GUIDE W/DEVICE KIT: 1.0000 | PACK | Freq: Once | 0 refills | Status: AC

## 2017-10-14 ENCOUNTER — Ambulatory Visit: Payer: Medicaid Other | Admitting: *Deleted

## 2017-10-14 ENCOUNTER — Encounter: Payer: Medicaid Other | Attending: Obstetrics and Gynecology | Admitting: *Deleted

## 2017-10-14 DIAGNOSIS — R7302 Impaired glucose tolerance (oral): Secondary | ICD-10-CM | POA: Insufficient documentation

## 2017-10-14 DIAGNOSIS — Z713 Dietary counseling and surveillance: Secondary | ICD-10-CM | POA: Insufficient documentation

## 2017-10-14 NOTE — Progress Notes (Signed)
  Patient was seen on 10/14/2017 for Gestational Diabetes self-management. EDD 12/24/2017. Patient states no history of GDM. Diet history obtained. Patient eats good variety of all food groups and beverages include mostly water with occasional fruit juice and half and half sweet tea.  The following learning objectives were met by the patient :   States the definition of Gestational Diabetes  States why dietary management is important in controlling blood glucose  Describes the effects of carbohydrates on blood glucose levels  Demonstrates ability to create a balanced meal plan  Demonstrates carbohydrate counting   States when to check blood glucose levels  Demonstrates proper blood glucose monitoring techniques  States the effect of stress and exercise on blood glucose levels  States the importance of limiting caffeine and abstaining from alcohol and smoking  Plan:  Aim for 3 Carb Choices per meal (45 grams) +/- 1 either way  Aim for 1-2 Carbs per snack Begin reading food labels for Total Carbohydrate of foods Consider  increasing your activity level by walking or other activity daily as tolerated Begin checking BG before breakfast and 2 hours after first bite of breakfast, lunch and dinner as directed by MD  Bring Log Book/Sheet to every medical appointment   Patient was introduced to [employer] and plans to use as record of BG electronically I will e-mail Baby [employer]s to add Glucose Management to her current account. Take medication if directed by MD  Patient already has a meter: Accu Chek, but has not started using it yet Patient instructed to test pre breakfast and 2 hours each meal as directed by MD BG today, fasting was 119 mg/dl  Patient instructed to monitor glucose levels: FBS: 60 - 95 mg/dl 2 hour: <120 mg/dl  Patient received the following handouts:  Nutrition Diabetes and Pregnancy  Carbohydrate Counting List  BG Log Sheet for back up until she gets started on  Baby Scripts  Patient will be seen for follow-up as needed.

## 2017-10-15 ENCOUNTER — Encounter: Payer: Medicaid Other | Admitting: Student

## 2017-10-22 ENCOUNTER — Ambulatory Visit (INDEPENDENT_AMBULATORY_CARE_PROVIDER_SITE_OTHER): Payer: Medicaid Other | Admitting: Obstetrics and Gynecology

## 2017-10-22 ENCOUNTER — Ambulatory Visit (HOSPITAL_COMMUNITY)
Admission: RE | Admit: 2017-10-22 | Discharge: 2017-10-22 | Disposition: A | Discharge status: Home / Self Care | Payer: Medicaid Other | Source: Ambulatory Visit | Attending: Medical | Admitting: Medical

## 2017-10-22 ENCOUNTER — Other Ambulatory Visit (HOSPITAL_COMMUNITY): Payer: Self-pay | Admitting: Obstetrics and Gynecology

## 2017-10-22 ENCOUNTER — Encounter (HOSPITAL_COMMUNITY): Payer: Self-pay

## 2017-10-22 ENCOUNTER — Other Ambulatory Visit (HOSPITAL_COMMUNITY): Payer: Self-pay | Admitting: *Deleted

## 2017-10-22 VITALS — BP 110/58 | HR 82 | Wt 154.1 lb

## 2017-10-22 DIAGNOSIS — Z3480 Encounter for supervision of other normal pregnancy, unspecified trimester: Secondary | ICD-10-CM

## 2017-10-22 DIAGNOSIS — O4403 Placenta previa specified as without hemorrhage, third trimester: Secondary | ICD-10-CM | POA: Diagnosis present

## 2017-10-22 DIAGNOSIS — Z362 Encounter for other antenatal screening follow-up: Secondary | ICD-10-CM

## 2017-10-22 DIAGNOSIS — O4443 Low lying placenta NOS or without hemorrhage, third trimester: Secondary | ICD-10-CM

## 2017-10-22 DIAGNOSIS — O4402 Placenta previa specified as without hemorrhage, second trimester: Secondary | ICD-10-CM

## 2017-10-22 DIAGNOSIS — O24410 Gestational diabetes mellitus in pregnancy, diet controlled: Secondary | ICD-10-CM

## 2017-10-22 DIAGNOSIS — O4420 Partial placenta previa NOS or without hemorrhage, unspecified trimester: Secondary | ICD-10-CM

## 2017-10-22 DIAGNOSIS — Z3A31 31 weeks gestation of pregnancy: Secondary | ICD-10-CM

## 2017-10-22 DIAGNOSIS — R7611 Nonspecific reaction to tuberculin skin test without active tuberculosis: Secondary | ICD-10-CM

## 2017-10-22 NOTE — Progress Notes (Signed)
Prenatal Visit Note Date: 10/22/2017 Clinic: [facility]  Subjective:  Ariel Sharp is a 32 y.o. G2P1001 at [redacted]w[redacted]d being seen today for ongoing prenatal care.  She is currently monitored for the following issues for this high-risk pregnancy and has Supervision of other normal pregnancy, antepartum; Placenta previa antepartum in second trimester; Positive PPD; and Gestational diabetes on their problem list.  Patient reports no complaints.   Contractions: Not present. Vag. Bleeding: None.  Movement: Present. Denies leaking of fluid.   The following portions of the patient's history were reviewed and updated as appropriate: allergies, current medications, past family history, past medical history, past social history, past surgical history and problem list. Problem list updated.  Objective:   Vitals:   10/22/17 1048  BP: (!) 110/58  Pulse: 82  Weight: 154 lb 1.6 oz (69.9 kg)    Fetal Status: Fetal Heart Rate (bpm): 152 Fundal Height: 31 cm Movement: Present     General:  Alert, oriented and cooperative. Patient is in no acute distress.  Skin: Skin is warm and dry. No rash noted.   Cardiovascular: Normal heart rate noted  Respiratory: Normal respiratory effort, no problems with respiration noted  Abdomen: Soft, gravid, appropriate for gestational age. Pain/Pressure: Absent     Pelvic:  Cervical exam deferred        Extremities: Normal range of motion.  Edema: None  Mental Status: Normal mood and affect. Normal behavior. Normal judgment and thought content.   Urinalysis:      Assessment and Plan:  Pregnancy: G2P1001 at [redacted]w[redacted]d  1. Supervision of other normal pregnancy, antepartum Routine care. D/w pt re: BC  2. Positive PPD Negative quant  3. Placenta previa antepartum in second trimester Growth u/s and TVUS not read from today but images look to show persistent previa at 1cm. Told her to continue with pelvic rest and she has repeat already scheduled  4.  Diet controlled gestational diabetes mellitus (GDM) in third trimester Normal BS values with diet control  Preterm labor symptoms and general obstetric precautions including but not limited to vaginal bleeding, contractions, leaking of fluid and fetal movement were reviewed in detail with the patient. Please refer to After Visit Summary for other counseling recommendations.  Return in about 10 days (around 11/01/2017) for hrob.   [place] BingPickens, Marcial Pless, MD

## 2017-10-29 ENCOUNTER — Encounter: Payer: Medicaid Other | Admitting: Student

## 2017-10-31 ENCOUNTER — Encounter: Payer: Self-pay | Admitting: General Practice

## 2017-11-05 ENCOUNTER — Ambulatory Visit (INDEPENDENT_AMBULATORY_CARE_PROVIDER_SITE_OTHER): Payer: Medicaid Other | Admitting: Obstetrics and Gynecology

## 2017-11-05 DIAGNOSIS — Z23 Encounter for immunization: Secondary | ICD-10-CM

## 2017-11-05 DIAGNOSIS — O0993 Supervision of high risk pregnancy, unspecified, third trimester: Secondary | ICD-10-CM | POA: Diagnosis present

## 2017-11-05 DIAGNOSIS — O0990 Supervision of high risk pregnancy, unspecified, unspecified trimester: Secondary | ICD-10-CM

## 2017-11-05 MED ORDER — TETANUS-DIPHTH-ACELL PERTUSSIS 5-2.5-18.5 LF-MCG/0.5 IM SUSP
0.5000 mL | Freq: Once | INTRAMUSCULAR | Status: AC
  Administered 2017-11-05: 0.5 mL via INTRAMUSCULAR

## 2017-11-05 NOTE — Progress Notes (Signed)
Prenatal Visit Note Date: 11/05/2017 Clinic: [facility]  Subjective:  Ariel Sharp is a 32 y.o. G2P1001 at 2897w0d being seen today for ongoing prenatal care.  She is currently monitored for the following issues for this high-risk pregnancy and has Supervision of high risk pregnancy, antepartum; Low lying placenta with hemorrhage, third trimester; Positive PPD; and Gestational diabetes on their problem list.  Patient reports some nausea and early satiety Contractions: Not present. Vag. Bleeding: None.  Movement: Present. Denies leaking of fluid.   The following portions of the patient's history were reviewed and updated as appropriate: allergies, current medications, past family history, past medical history, past social history, past surgical history and problem list. Problem list updated.  Objective:   Vitals:   11/05/17 0824  BP: 107/61  Pulse: 72  Weight: 152 lb 1.6 oz (69 kg)    Fetal Status: Fetal Heart Rate (bpm): 135   Movement: Present     General:  Alert, oriented and cooperative. Patient is in no acute distress.  Skin: Skin is warm and dry. No rash noted.   Cardiovascular: Normal heart rate noted  Respiratory: Normal respiratory effort, no problems with respiration noted  Abdomen: Soft, gravid, appropriate for gestational age. Pain/Pressure: Present     Pelvic:  Cervical exam performed        Extremities: Normal range of motion.  Edema: None  Mental Status: Normal mood and affect. Normal behavior. Normal judgment and thought content.   Urinalysis:      Assessment and Plan:  Pregnancy: G2P1001 at 6497w0d  1. Supervision of high risk pregnancy, antepartum Routine care. Mirena  2. GDMa1 Didn't bring log book but does it qid and with normal values. Normal growth mid June  3. LL placenta F/u mid July. D/w re: 37wk c/s if still LL  Preterm labor symptoms and general obstetric precautions including but not limited to vaginal bleeding,  contractions, leaking of fluid and fetal movement were reviewed in detail with the patient. Please refer to After Visit Summary for other counseling recommendations.  Return in about 2 weeks (around 11/19/2017) for hrob.   [place] BingPickens, Ariel Towe, MD

## 2017-11-12 ENCOUNTER — Encounter: Payer: Self-pay | Admitting: General Practice

## 2017-11-19 ENCOUNTER — Ambulatory Visit (HOSPITAL_COMMUNITY)
Admission: RE | Admit: 2017-11-19 | Discharge: 2017-11-19 | Disposition: A | Discharge status: Home / Self Care | Payer: Medicaid Other | Source: Ambulatory Visit | Attending: Medical | Admitting: Medical

## 2017-11-19 ENCOUNTER — Other Ambulatory Visit (HOSPITAL_COMMUNITY): Payer: Self-pay | Admitting: Maternal and Fetal Medicine

## 2017-11-19 ENCOUNTER — Encounter (HOSPITAL_COMMUNITY): Payer: Self-pay

## 2017-11-19 DIAGNOSIS — O0990 Supervision of high risk pregnancy, unspecified, unspecified trimester: Secondary | ICD-10-CM

## 2017-11-19 DIAGNOSIS — O4420 Partial placenta previa NOS or without hemorrhage, unspecified trimester: Secondary | ICD-10-CM

## 2017-11-19 DIAGNOSIS — O358XX0 Maternal care for other (suspected) fetal abnormality and damage, not applicable or unspecified: Secondary | ICD-10-CM

## 2017-11-19 DIAGNOSIS — Z3A35 35 weeks gestation of pregnancy: Secondary | ICD-10-CM

## 2017-11-19 DIAGNOSIS — R7611 Nonspecific reaction to tuberculin skin test without active tuberculosis: Secondary | ICD-10-CM

## 2017-11-19 DIAGNOSIS — O4402 Placenta previa specified as without hemorrhage, second trimester: Secondary | ICD-10-CM | POA: Diagnosis present

## 2017-11-19 DIAGNOSIS — Z362 Encounter for other antenatal screening follow-up: Secondary | ICD-10-CM | POA: Insufficient documentation

## 2017-11-19 NOTE — Procedures (Signed)
Ariel Sharp 1986/02/22 4067w0d  Fetus A Non-Stress Test Interpretation for 11/19/17  Indication: Unsatisfactory BPP  Fetal Heart Rate A Mode: External Baseline Rate (A): 145 bpm Variability: Moderate Accelerations: 15 x 15 Decelerations: None Multiple birth?: No  Uterine Activity Mode: Palpation, Toco Contraction Frequency (min): Occas. Contraction Duration (sec): 40-60 Contraction Quality: Mild Resting Tone Palpated: Relaxed Resting Time: Adequate  Interpretation (Fetal Testing) Nonstress Test Interpretation: Reactive Overall Impression: Reassuring for gestational age Comments: EFM tracing reviewed by Dr. Judeth CornfieldShankar

## 2017-11-20 ENCOUNTER — Other Ambulatory Visit (HOSPITAL_COMMUNITY): Payer: Self-pay | Admitting: *Deleted

## 2017-11-20 ENCOUNTER — Ambulatory Visit (INDEPENDENT_AMBULATORY_CARE_PROVIDER_SITE_OTHER): Payer: Medicaid Other | Admitting: Obstetrics & Gynecology

## 2017-11-20 VITALS — BP 114/76 | HR 100 | Wt 150.2 lb

## 2017-11-20 DIAGNOSIS — K219 Gastro-esophageal reflux disease without esophagitis: Secondary | ICD-10-CM

## 2017-11-20 DIAGNOSIS — O99613 Diseases of the digestive system complicating pregnancy, third trimester: Secondary | ICD-10-CM

## 2017-11-20 DIAGNOSIS — IMO0001 Reserved for inherently not codable concepts without codable children: Secondary | ICD-10-CM

## 2017-11-20 DIAGNOSIS — O99891 Other specified diseases and conditions complicating pregnancy: Secondary | ICD-10-CM | POA: Insufficient documentation

## 2017-11-20 DIAGNOSIS — O0990 Supervision of high risk pregnancy, unspecified, unspecified trimester: Secondary | ICD-10-CM

## 2017-11-20 DIAGNOSIS — O24410 Gestational diabetes mellitus in pregnancy, diet controlled: Secondary | ICD-10-CM

## 2017-11-20 MED ORDER — FAMOTIDINE 20 MG PO TABS: 20.0000 mg | ORAL_TABLET | Freq: Two times a day (BID) | ORAL | 3 refills | Status: DC

## 2017-11-20 NOTE — Progress Notes (Signed)
PRENATAL VISIT NOTE  Subjective:  Ariel Sharp is a 32 y.o. G2P1001 at [redacted]w[redacted]d being seen today for ongoing prenatal care.  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  Patient reports decreased appetite and some reflux.  Contractions: Not present. Vag. Bleeding: None.  Movement: Present. Denies leaking of fluid.   The following portions of the patient's history were reviewed and updated as appropriate: allergies, current medications, past family history, past medical history, past social history, past surgical history and problem list. Problem list updated.  Objective:   Vitals:   11/20/17 1512  BP: 114/76  Pulse: 100  Weight: 150 lb 3.2 oz (68.1 kg)    Fetal Status: Fetal Heart Rate (bpm): 154 Fundal Height: 35 cm Movement: Present     General:  Alert, oriented and cooperative. Patient is in no acute distress.  Skin: Skin is warm and dry. No rash noted.   Cardiovascular: Normal heart rate noted  Respiratory: Normal respiratory effort, no problems with respiration noted  Abdomen: Soft, gravid, appropriate for gestational age.  Pain/Pressure: Present     Pelvic: Cervical exam deferred        Extremities: Normal range of motion.  Edema: Trace  Mental Status: Normal mood and affect. Normal behavior. Normal judgment and thought content.   [place] Mfm Ob Transvaginal  Result Date: 11/19/2017 ----------------------------------------------------------------------  OBSTETRICS REPORT                      (Signed Final 11/19/2017 06:06 pm) ---------------------------------------------------------------------- Patient Info  ID #:       [id number]                          D.O.B.:  15-Apr-1986 (31 yrs)  Name:       Ariel Sharp                Visit Date: 11/19/2017 02:45 pm  ---------------------------------------------------------------------- Performed By  Performed By:     Eden Lathe BS      Ref. Address:     [facility]                    RDMS RVT                                                             [facility]                                                             [address]                                                             Rd  [place], [place]                                                             [address]  Attending:        Noralee Space MD        Location:         [facility]  Referred By:      [facility] for                    [facility]                    Healthcare ---------------------------------------------------------------------- Orders   #  Description                                 Code   1  [place] MFM OB FOLLOW UP                         60454.09   2  [place] MFM OB TRANSVAGINAL                      81191.4   3  [place] MFM FETAL BPP W/NONSTRESS                78295.6  ----------------------------------------------------------------------   #  Ordered By               Order #        Accession #    Episode #   1  Particia Nearing            [id number]      [id number]     [id number]   2  MARTHA DECKER            [id number]      [id number]     [id number]   3  MARTHA DECKER            [id number]      [id number]     [id number]  ---------------------------------------------------------------------- Indications   [redacted] weeks gestation of pregnancy                Z3A.35   Encounter for other antenatal screening        Z36.2   follow-up   Placenta previa specified as without           O44.02   hemorrhage, second trimester  ---------------------------------------------------------------------- OB History  Gravidity:    2         Term:   1        Prem:   0        SAB:   0  TOP:          0       Ectopic:  0        Living: 1  ---------------------------------------------------------------------- Fetal Evaluation  Num Of Fetuses:     1  Fetal Heart         149  Rate(bpm):  Cardiac Activity:   Observed  Presentation:  Cephalic  Placenta:           Posterior, above cervical os  P. Cord Insertion:  Visualized  Amniotic Fluid  AFI FV:      Subjectively within normal limits  AFI Sum(cm)     %Tile       Largest Pocket(cm)  14.34           51          5.64  RUQ(cm)       RLQ(cm)       LUQ(cm)        LLQ(cm)  3.7           2.39          2.61           5.64 ---------------------------------------------------------------------- Biophysical Evaluation  Amniotic F.V:   Within normal limits       F. Tone:        Observed  F. Movement:    Observed                   N.S.T:          Reactive  F. Breathing:   Not Observed               Score:          8/10 ---------------------------------------------------------------------- Biometry  BPD:      82.6  mm     G. Age:  33w 2d          9  %    CI:        76.05   %    70 - 86                                                          FL/HC:      22.1   %    20.1 - 22.3  HC:      300.2  mm     G. Age:  33w 2d        < 3  %    HC/AC:      0.92        0.93 - 1.11  AC:      326.3  mm     G. Age:  36w 4d         91  %    FL/BPD:     80.1   %    71 - 87  FL:       66.2  mm     G. Age:  34w 1d         21  %    FL/AC:      20.3   %    20 - 24  HUM:      59.2  mm     G. Age:  34w 2d         52  %  Est. FW:    2617  gm    5 lb 12 oz      66  % ---------------------------------------------------------------------- Gestational Age  U/S Today:     34w 2d  EDD:   12/29/17  Best:          Consuello Closs 0d     Det. ByMarcella Dubs         EDD:   12/24/17                                      (05/02/17) ---------------------------------------------------------------------- Anatomy  Cranium:               Appears normal         LVOT:                   Appears normal  Cavum:                  Previously seen        Aortic Arch:            Appears normal  Ventricles:            Appears normal         Ductal Arch:            Previously seen  Choroid Plexus:        Appears normal         Diaphragm:              Appears normal  Cerebellum:            Appears normal         Stomach:                Appears normal, left                                                                        sided  Posterior Fossa:       Previously seen        Abdominal Wall:         Previously seen  Nuchal Fold:           Previously seen        Cord Vessels:           Appears normal (3                                                                        vessel cord)  Face:                  Appears normal         Kidneys:                Appear normal                         (orbits and profile)  Lips:                  Appears normal         Bladder:  Appears normal  Thoracic:              Appears normal         Spine:                  Previously seen  Heart:                 Appears normal         Upper Extremities:      Previously seen                         (4CH, axis, and situs  RVOT:                  Appears normal         Lower Extremities:      Previously seen  Other:  Female gender previously seen. Heels previously visualized. Nasal          bone prev visualized. ---------------------------------------------------------------------- Impression  Patient returned for evaluation of placental position.  Fetal growth is appropriate for gestational age. Amniotic fluid  is normal and good fetal activity is seen.  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 Cardiac anatomy appears  normal. We performed BPP because of the finding of  umbilical vein varix (UVV). Fetal breathing movements were  not seen over 30-min observation. NST was later performed  that was reactive.  BPP 8/10.  On transvaginal ultrasound, there was no placenta previa.  I reassured the patient of the placental findings.  I explained that isolated UVV is not  associated with adverse  outcomes. Earlier reports have mentioned association with  stillbirth that was not confirmed on later studies. ---------------------------------------------------------------------- Recommendations  -Vaginal delivery is not contraindicated.  -Weekly BPP.  -Delivery at 39 weeks. ----------------------------------------------------------------------                  Noralee Space, MD Electronically Signed Final Report   11/19/2017 06:06 pm ----------------------------------------------------------------------  [place] Mfm Ob Transvaginal  Result Date: 10/22/2017 ----------------------------------------------------------------------  OBSTETRICS REPORT                      (Signed Final 10/22/2017 08:05 pm) ---------------------------------------------------------------------- Patient Info  ID #:       [id number]                          D.O.B.:  03/01/1986 (31 yrs)  Name:       Ariel Sharp                Visit Date: 10/22/2017 08:36 am ---------------------------------------------------------------------- Performed By  Performed By:     Tomma Lightning             Ref. Address:     [facility]                    RDMS,RVT                                                             OB[facility]Gyn Clinic  [address]                                                             [place], [place]                                                             [address]  Attending:        Particia Nearing MD       Location:         [facility]  Referred By:      [facility] for                    [facility]                    Healthcare ---------------------------------------------------------------------- Orders   #  Description                                 Code   1  [place] MFM OB FOLLOW UP                         [id number]   2  [place] MFM OB TRANSVAGINAL                       52841.3  ----------------------------------------------------------------------   #  Ordered By               Order #        Accession #    Episode #   1  MARK Ezzard Standing              [id number]      [id number]     [id number]   2  MARK NEWMAN              [id number]      [id number]     [id number]  ---------------------------------------------------------------------- Indications   [redacted] weeks gestation of pregnancy                Z3A.31   Encounter for other antenatal screening        Z36.2   follow-up   Placenta previa specified as without           O44.02   hemorrhage, second trimester  ---------------------------------------------------------------------- OB History  Gravidity:    2         Term:   1        Prem:   0        SAB:  0  TOP:          0       Ectopic:  0        Living: 1 ---------------------------------------------------------------------- Fetal Evaluation  Num Of Fetuses:     1  Fetal Heart         148  Rate(bpm):  Cardiac Activity:   Observed  Presentation:       Cephalic  Placenta:           Low-lying, 0.9 cm from int os  P. Cord Insertion:  Previously Visualized  Amniotic Fluid  AFI FV:      Subjectively within normal limits  AFI Sum(cm)     %Tile       Largest Pocket(cm)  9.31            9           3.12  RUQ(cm)       RLQ(cm)       LUQ(cm)        LLQ(cm)  2.2           1             3.12           2.99 ---------------------------------------------------------------------- Biometry  BPD:        75  mm     G. Age:  30w 1d         15  %    CI:         75.7   %    70 - 86                                                          FL/HC:      21.4   %    19.3 - 21.3  HC:      273.3  mm     G. Age:  29w 6d        < 3  %    HC/AC:      0.96        0.96 - 1.17  AC:      285.6  mm     G. Age:  32w 4d         87  %    FL/BPD:     78.0   %    71 - 87  FL:       58.5  mm     G. Age:  30w 4d         24  %    FL/AC:      20.5   %    20 - 24  HUM:      54.4  mm     G. Age:  31w 4d         66  %  Est. FW:    1775  gm     3 lb 15 oz      65  % ---------------------------------------------------------------------- Gestational Age  U/S Today:     30w 6d                                        EDD:  12/25/17  Best:          31w 0d     Det. ByMarcella Dubs         EDD:   12/24/17                                      (05/02/17) ---------------------------------------------------------------------- Anatomy  Cranium:               Appears normal         Aortic Arch:            Previously seen  Cavum:                 Previously seen        Ductal Arch:            Previously seen  Ventricles:            Appears normal         Diaphragm:              Appears normal  Choroid Plexus:        Previously seen        Stomach:                Appears normal, left                                                                        sided  Cerebellum:            Previously seen        Abdomen:                Previously seen  Posterior Fossa:       Previously seen        Abdominal Wall:         Previously seen  Nuchal Fold:           Previously seen        Cord Vessels:           Previously seen  Face:                  Orbits and profile     Kidneys:                Appear normal                         previously seen  Lips:                  Previously seen        Bladder:                Appears normal  Thoracic:              Appears normal         Spine:                  Previously seen  Heart:                 Appears normal  Upper Extremities:      Previously seen                         (4CH, axis, and situs  RVOT:                  Appears normal         Lower Extremities:      Previously seen  LVOT:                  Appears normal  Other:  Female gender previously seen. Heels previously visualized. Nasal          bone prev visualized. ---------------------------------------------------------------------- Cervix Uterus Adnexa  Cervix  Length:            4.1  cm.  Measured transvaginally.  ---------------------------------------------------------------------- Impression  SIUP at 31+6 weeks  Normal interval anatomy; anatomic survey complete  Normal amniotic fluid volume  Appropriate interval growth with EFW at the  EV views of cervix and placenta: normal length without  funneling; inferior edge of posterior placenta ended  1 cm  from internal os; essentially no significant change since 05/01 ---------------------------------------------------------------------- Recommendations  Follow-up ultrasound for growth and reassessment of  placental location in 4 weeks ----------------------------------------------------------------------                 Particia Nearing, MD Electronically Signed Final Report   10/22/2017 08:05 pm ----------------------------------------------------------------------  [place] Mfm Fetal Bpp W/nonstress  Result Date: 11/19/2017 ----------------------------------------------------------------------  OBSTETRICS REPORT                      (Signed Final 11/19/2017 06:06 pm) ---------------------------------------------------------------------- Patient Info  ID #:       [id number]                          D.O.B.:  17-Mar-1986 (31 yrs)  Name:       Ariel Sharp                Visit Date: 11/19/2017 02:45 pm ---------------------------------------------------------------------- Performed By  Performed By:     Eden Lathe BS      Ref. Address:     [facility]                    RDMS RVT                                                             OB[facility]Gyn Clinic                                                             [address]                                                             [address]  [place], [place]                                                             [address]  Attending:        Noralee Space MD        Location:         [facility]  Referred By:      [facility] for                     [facility]                    Healthcare ---------------------------------------------------------------------- Orders   #  Description                                 Code   1  [place] MFM OB FOLLOW UP                         60454.09   2  [place] MFM OB TRANSVAGINAL                      81191.4   3  [place] MFM FETAL BPP W/NONSTRESS                78295.6  ----------------------------------------------------------------------   #  Ordered By               Order #        Accession #    Episode #   1  Particia Nearing            [id number]      [id number]     [id number]   2  MARTHA DECKER            [id number]      [id number]     [id number]   3  MARTHA DECKER            [id number]      [id number]     [id number]  ---------------------------------------------------------------------- Indications   [redacted] weeks gestation of pregnancy                Z3A.35   Encounter for other antenatal screening        Z36.2   follow-up   Placenta previa specified as without           O44.02   hemorrhage, second trimester  ---------------------------------------------------------------------- OB History  Gravidity:    2         Term:   1        Prem:   0        SAB:   0  TOP:          0       Ectopic:  0        Living: 1 ---------------------------------------------------------------------- Fetal Evaluation  Num Of Fetuses:     1  Fetal Heart         149  Rate(bpm):  Cardiac Activity:   Observed  Presentation:  Cephalic  Placenta:           Posterior, above cervical os  P. Cord Insertion:  Visualized  Amniotic Fluid  AFI FV:      Subjectively within normal limits  AFI Sum(cm)     %Tile       Largest Pocket(cm)  14.34           51          5.64  RUQ(cm)       RLQ(cm)       LUQ(cm)        LLQ(cm)  3.7           2.39          2.61           5.64 ---------------------------------------------------------------------- Biophysical Evaluation  Amniotic F.V:   Within normal limits       F. Tone:        Observed  F. Movement:    Observed                    N.S.T:          Reactive  F. Breathing:   Not Observed               Score:          8/10 ---------------------------------------------------------------------- Biometry  BPD:      82.6  mm     G. Age:  33w 2d          9  %    CI:        76.05   %    70 - 86                                                          FL/HC:      22.1   %    20.1 - 22.3  HC:      300.2  mm     G. Age:  33w 2d        < 3  %    HC/AC:      0.92        0.93 - 1.11  AC:      326.3  mm     G. Age:  36w 4d         91  %    FL/BPD:     80.1   %    71 - 87  FL:       66.2  mm     G. Age:  34w 1d         21  %    FL/AC:      20.3   %    20 - 24  HUM:      59.2  mm     G. Age:  34w 2d         52  %  Est. FW:    2617  gm    5 lb 12 oz      66  % ---------------------------------------------------------------------- Gestational Age  U/S Today:     34w 2d  EDD:   12/29/17  Best:          Consuello Closs 0d     Det. ByMarcella Dubs         EDD:   12/24/17                                      (05/02/17) ---------------------------------------------------------------------- Anatomy  Cranium:               Appears normal         LVOT:                   Appears normal  Cavum:                 Previously seen        Aortic Arch:            Appears normal  Ventricles:            Appears normal         Ductal Arch:            Previously seen  Choroid Plexus:        Appears normal         Diaphragm:              Appears normal  Cerebellum:            Appears normal         Stomach:                Appears normal, left                                                                        sided  Posterior Fossa:       Previously seen        Abdominal Wall:         Previously seen  Nuchal Fold:           Previously seen        Cord Vessels:           Appears normal (3                                                                        vessel cord)  Face:                  Appears normal         Kidneys:                Appear  normal                         (orbits and profile)  Lips:                  Appears normal         Bladder:  Appears normal  Thoracic:              Appears normal         Spine:                  Previously seen  Heart:                 Appears normal         Upper Extremities:      Previously seen                         (4CH, axis, and situs  RVOT:                  Appears normal         Lower Extremities:      Previously seen  Other:  Female gender previously seen. Heels previously visualized. Nasal          bone prev visualized. ---------------------------------------------------------------------- Impression  Patient returned for evaluation of placental position.  Fetal growth is appropriate for gestational age. Amniotic fluid  is normal and good fetal activity is seen.  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 Cardiac anatomy appears  normal. We performed BPP because of the finding of  umbilical vein varix (UVV). Fetal breathing movements were  not seen over 30-min observation. NST was later performed  that was reactive.  BPP 8/10.  On transvaginal ultrasound, there was no placenta previa.  I reassured the patient of the placental findings.  I explained that isolated UVV is not associated with adverse  outcomes. Earlier reports have mentioned association with  stillbirth that was not confirmed on later studies. ---------------------------------------------------------------------- Recommendations  -Vaginal delivery is not contraindicated.  -Weekly BPP.  -Delivery at 39 weeks. ----------------------------------------------------------------------                  Noralee Space, MD Electronically Signed Final Report   11/19/2017 06:06 pm ----------------------------------------------------------------------  [place] Mfm Fetal Bpp Wo Non Stress  Result Date: 11/19/2017 Arvil Persons, RN     11/19/2017  4:52 PM Margaretha Glassing R Looper 03/04/1986 [redacted]w[redacted]d Fetus A Non-Stress Test Interpretation for 11/19/17 Indication:  Unsatisfactory BPP Fetal Heart Rate A Mode: External Baseline Rate (A): 145 bpm Variability: Moderate Accelerations: 15 x 15 Decelerations: None Multiple birth?: No Uterine Activity Mode: Palpation, Toco Contraction Frequency (min): Occas. Contraction Duration (sec): 40-60 Contraction Quality: Mild Resting Tone Palpated: Relaxed Resting Time: Adequate Interpretation (Fetal Testing) Nonstress Test Interpretation: Reactive Overall Impression: Reassuring for gestational age Comments: EFM tracing reviewed by Dr. Judeth Cornfield   [place] Mfm Ob Follow Up  Result Date: 11/19/2017 ----------------------------------------------------------------------  OBSTETRICS REPORT                      (Signed Final 11/19/2017 06:06 pm) ---------------------------------------------------------------------- Patient Info  ID #:       [id number]                          D.O.B.:  Nov 22, 1985 (31 yrs)  Name:       Ariel Sharp                Visit Date: 11/19/2017 02:45 pm ---------------------------------------------------------------------- Performed By  Performed By:     Eden Lathe BS      Ref. Address:     [facility]  RDMS RVT                                                             OB/Gyn Clinic                                                             [address]                                                             [place], [place]                                                             [address]  Attending:        Noralee Space MD        Location:         [facility]  Referred By:      [facility] for                    [facility]                    Healthcare ---------------------------------------------------------------------- Orders   #  Description                                 Code   1  [place] MFM Chanetta Marshall                         60454.09   2  [place] MFM [facility] TRANSVAGINAL                      81191.4   3   [place] MFM FETAL BPP W/NONSTRESS                78295.6  ----------------------------------------------------------------------   #  Ordered By               Order #        Accession #    Episode #   1  Particia Nearing            [id number]  [phone]     [id number]   2  MARTHA DECKER            [id number]      [id number]     [id number]   3  MARTHA DECKER            [id number]      [phone]     [id number]  ---------------------------------------------------------------------- Indications   [redacted] weeks gestation of pregnancy                Z3A.35   Encounter for other antenatal screening        Z36.2   follow-up   Placenta previa specified as without           O44.02   hemorrhage, second trimester  ---------------------------------------------------------------------- OB History  Gravidity:    2         Term:   1        Prem:   0        SAB:   0  TOP:          0       Ectopic:  0        Living: 1 ---------------------------------------------------------------------- Fetal Evaluation  Num Of Fetuses:     1  Fetal Heart         149  Rate(bpm):  Cardiac Activity:   Observed  Presentation:       Cephalic  Placenta:           Posterior, above cervical os  P. Cord Insertion:  Visualized  Amniotic Fluid  AFI FV:      Subjectively within normal limits  AFI Sum(cm)     %Tile       Largest Pocket(cm)  14.34           51          5.64  RUQ(cm)       RLQ(cm)       LUQ(cm)        LLQ(cm)  3.7           2.39          2.61           5.64 ---------------------------------------------------------------------- Biophysical Evaluation  Amniotic F.V:   Within normal limits       F. Tone:        Observed  F. Movement:    Observed                   N.S.T:          Reactive  F. Breathing:   Not Observed               Score:          8/10 ---------------------------------------------------------------------- Biometry  BPD:      82.6  mm     G. Age:  33w 2d          9  %    CI:        76.05   %    70 - 86                                                           FL/HC:      22.1   %  20.1 - 22.3  HC:      300.2  mm     G. Age:  33w 2d        < 3  %    HC/AC:      0.92        0.93 - 1.11  AC:      326.3  mm     G. Age:  36w 4d         91  %    FL/BPD:     80.1   %    71 - 87  FL:       66.2  mm     G. Age:  34w 1d         21  %    FL/AC:      20.3   %    20 - 24  HUM:      59.2  mm     G. Age:  34w 2d         52  %  Est. FW:    2617  gm    5 lb 12 oz      66  % ---------------------------------------------------------------------- Gestational Age  U/S Today:     34w 2d                                        EDD:   12/29/17  Best:          35w 0d     Det. ByMarcella Dubs         EDD:   12/24/17                                      (05/02/17) ---------------------------------------------------------------------- Anatomy  Cranium:               Appears normal         LVOT:                   Appears normal  Cavum:                 Previously seen        Aortic Arch:            Appears normal  Ventricles:            Appears normal         Ductal Arch:            Previously seen  Choroid Plexus:        Appears normal         Diaphragm:              Appears normal  Cerebellum:            Appears normal         Stomach:                Appears normal, left  sided  Posterior Fossa:       Previously seen        Abdominal Wall:         Previously seen  Nuchal Fold:           Previously seen        Cord Vessels:           Appears normal (3                                                                        vessel cord)  Face:                  Appears normal         Kidneys:                Appear normal                         (orbits and profile)  Lips:                  Appears normal         Bladder:                Appears normal  Thoracic:              Appears normal         Spine:                  Previously seen  Heart:                 Appears normal         Upper Extremities:      Previously  seen                         (4CH, axis, and situs  RVOT:                  Appears normal         Lower Extremities:      Previously seen  Other:  Female gender previously seen. Heels previously visualized. Nasal          bone prev visualized. ---------------------------------------------------------------------- Impression  Patient returned for evaluation of placental position.  Fetal growth is appropriate for gestational age. Amniotic fluid  is normal and good fetal activity is seen.  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 Cardiac anatomy appears  normal. We performed BPP because of the finding of  umbilical vein varix (UVV). Fetal breathing movements were  not seen over 30-min observation. NST was later performed  that was reactive.  BPP 8/10.  On transvaginal ultrasound, there was no placenta previa.  I reassured the patient of the placental findings.  I explained that isolated UVV is not associated with adverse  outcomes. Earlier reports have mentioned association with  stillbirth that was not confirmed on later studies. ---------------------------------------------------------------------- Recommendations  -Vaginal delivery is not contraindicated.  -Weekly BPP.  -Delivery at 39 weeks. ----------------------------------------------------------------------                  Noralee Space, MD Electronically Signed Final Report   11/19/2017 06:06 pm ----------------------------------------------------------------------  [place] Mfm Ob Follow Up  Result Date: 10/22/2017 ----------------------------------------------------------------------  OBSTETRICS REPORT                      (Signed Final 10/22/2017 08:05 pm) ---------------------------------------------------------------------- Patient Info  ID #:       [id number]                          D.O.B.:  09/10/1985 (31 yrs)  Name:       Ariel Sharp                Visit Date: 10/22/2017 08:36 am ---------------------------------------------------------------------- Performed By   Performed By:     Tomma Lightning             Ref. Address:     [facility]                    RD[facility],R[facility]                                                             [facility]                                                             [address]                                                             [place], [place]                                                             [address]  Attending:        Particia Nearing MD       Location:         [facility]  Referred By:      [facility] for                    [facility]                    Healthcare ---------------------------------------------------------------------- Orders   #  Description  Code   1  [place] MFM OB FOLLOW UP                         [id number]   2  [place] MFM OB TRANSVAGINAL                      [id number]  ----------------------------------------------------------------------   #  Ordered By               Order #        Accession #    Episode #   1  MARK NEWMAN              [id number]      [id number]     [id number]   2  MARK NEWMAN              [id number]      [id number]     [id number]  ---------------------------------------------------------------------- Indications   [redacted] weeks gestation of pregnancy                Z3A.31   Encounter for other antenatal screening        Z36.2   follow-up   Placenta previa specified as without           O44.02   hemorrhage, second trimester  ---------------------------------------------------------------------- OB History  Gravidity:    2         Term:   1        Prem:   0        SAB:   0  TOP:          0       Ectopic:  0        Living: 1 ---------------------------------------------------------------------- Fetal Evaluation  Num Of Fetuses:     1  Fetal Heart         148  Rate(bpm):  Cardiac Activity:   Observed  Presentation:       Cephalic  Placenta:           Low-lying, 0.9 cm from int os  P.  Cord Insertion:  Previously Visualized  Amniotic Fluid  AFI FV:      Subjectively within normal limits  AFI Sum(cm)     %Tile       Largest Pocket(cm)  9.31            9           3.12  RUQ(cm)       RLQ(cm)       LUQ(cm)        LLQ(cm)  2.2           1             3.12           2.99 ---------------------------------------------------------------------- Biometry  BPD:        75  mm     G. Age:  30w 1d         15  %    CI:         75.7   %    70 - 86  FL/HC:      21.4   %    19.3 - 21.3  HC:      273.3  mm     G. Age:  29w 6d        < 3  %    HC/AC:      0.96        0.96 - 1.17  AC:      285.6  mm     G. Age:  32w 4d         87  %    FL/BPD:     78.0   %    71 - 87  FL:       58.5  mm     G. Age:  30w 4d         24  %    FL/AC:      20.5   %    20 - 24  HUM:      54.4  mm     G. Age:  31w 4d         66  %  Est. FW:    1775  gm    3 lb 15 oz      65  % ---------------------------------------------------------------------- Gestational Age  U/S Today:     30w 6d                                        EDD:   12/25/17  Best:          31w 0d     Det. ByMarcella Dubs         EDD:   12/24/17                                      (05/02/17) ---------------------------------------------------------------------- Anatomy  Cranium:               Appears normal         Aortic Arch:            Previously seen  Cavum:                 Previously seen        Ductal Arch:            Previously seen  Ventricles:            Appears normal         Diaphragm:              Appears normal  Choroid Plexus:        Previously seen        Stomach:                Appears normal, left                                                                        sided  Cerebellum:            Previously seen  Abdomen:                Previously seen  Posterior Fossa:       Previously seen        Abdominal Wall:         Previously seen  Nuchal Fold:           Previously seen        Cord  Vessels:           Previously seen  Face:                  Orbits and profile     Kidneys:                Appear normal                         previously seen  Lips:                  Previously seen        Bladder:                Appears normal  Thoracic:              Appears normal         Spine:                  Previously seen  Heart:                 Appears normal         Upper Extremities:      Previously seen                         (4CH, axis, and situs  RVOT:                  Appears normal         Lower Extremities:      Previously seen  LVOT:                  Appears normal  Other:  Female gender previously seen. Heels previously visualized. Nasal          bone prev visualized. ---------------------------------------------------------------------- Cervix Uterus Adnexa  Cervix  Length:            4.1  cm.  Measured transvaginally. ---------------------------------------------------------------------- Impression  SIUP at 31+6 weeks  Normal interval anatomy; anatomic survey complete  Normal amniotic fluid volume  Appropriate interval growth with EFW at the  EV views of cervix and placenta: normal length without  funneling; inferior edge of posterior placenta ended  1 cm  from internal os; essentially no significant change since 05/01 ---------------------------------------------------------------------- Recommendations  Follow-up ultrasound for growth and reassessment of  placental location in 4 weeks ----------------------------------------------------------------------                 Particia Nearing, MD Electronically Signed Final Report   10/22/2017 08:05 pm ----------------------------------------------------------------------   Assessment and Plan:  Pregnancy: G2P1001 at [redacted]w[redacted]d  1. 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 Judeth Cornfield (MFM).  No other abnormalities seen.  He recommended weekly BPP and IOL at 39 weeks; earlier if indicated.  Will continue  to follow MFM recommendations  2. Diet controlled gestational diabetes mellitus (GDM) in third trimester Stable CBGs.  Continue diet and exercise.  3. Gastroesophageal reflux in pregnancy in third  trimester Pepcid prescribed. - famotidine (PEPCID) 20 MG tablet; Take 1 tablet (20 mg total) by mouth 2 (two) times daily.  Dispense: 60 tablet; Refill: 3  4. Supervision of high risk pregnancy, antepartum Preterm labor symptoms and general obstetric precautions including but not limited to vaginal bleeding, contractions, leaking of fluid and fetal movement were reviewed in detail with the patient. Please refer to After Visit Summary for other counseling recommendations.  Return in about 8 days (around 11/28/2017) for OB Visit [facility]) - schedule before or after ultrasound (Do this for other [facility] visits on 7/26, 8/26).    Future Appointments  Date Time Provider Department Center  11/28/2017 10:15 AM WH-MFC [place] 4 WH-MFCUS MFC-US  12/05/2017  9:45 AM WH-MFC [place] 2 WH-MFCUS MFC-US  12/12/2017  8:30 AM WH-MFC [place] 5 WH-MFCUS MFC-US    Jaynie Collins, MD

## 2017-11-20 NOTE — Patient Instructions (Signed)
Return to clinic for any scheduled appointments or obstetric concerns, or go to MAU for evaluation  

## 2017-11-28 ENCOUNTER — Ambulatory Visit (INDEPENDENT_AMBULATORY_CARE_PROVIDER_SITE_OTHER): Payer: Medicaid Other | Admitting: Obstetrics & Gynecology

## 2017-11-28 ENCOUNTER — Other Ambulatory Visit (HOSPITAL_COMMUNITY): Payer: Self-pay | Admitting: Obstetrics and Gynecology

## 2017-11-28 ENCOUNTER — Ambulatory Visit (HOSPITAL_COMMUNITY): Payer: Medicaid Other

## 2017-11-28 ENCOUNTER — Ambulatory Visit (HOSPITAL_COMMUNITY)
Admission: RE | Admit: 2017-11-28 | Discharge: 2017-11-28 | Disposition: A | Discharge status: Home / Self Care | Payer: Medicaid Other | Source: Ambulatory Visit | Attending: Family Medicine | Admitting: Family Medicine

## 2017-11-28 ENCOUNTER — Other Ambulatory Visit (HOSPITAL_COMMUNITY)
Admission: RE | Admit: 2017-11-28 | Discharge: 2017-11-28 | Disposition: A | Discharge status: Home / Self Care | Payer: Medicaid Other | Source: Ambulatory Visit | Attending: Obstetrics & Gynecology | Admitting: Obstetrics & Gynecology

## 2017-11-28 ENCOUNTER — Encounter (HOSPITAL_COMMUNITY): Payer: Self-pay

## 2017-11-28 VITALS — BP 110/65 | HR 93 | Wt 158.7 lb

## 2017-11-28 DIAGNOSIS — O24410 Gestational diabetes mellitus in pregnancy, diet controlled: Secondary | ICD-10-CM | POA: Diagnosis present

## 2017-11-28 DIAGNOSIS — IMO0001 Reserved for inherently not codable concepts without codable children: Secondary | ICD-10-CM

## 2017-11-28 DIAGNOSIS — Z3A36 36 weeks gestation of pregnancy: Secondary | ICD-10-CM

## 2017-11-28 DIAGNOSIS — O0993 Supervision of high risk pregnancy, unspecified, third trimester: Secondary | ICD-10-CM | POA: Diagnosis present

## 2017-11-28 DIAGNOSIS — O0990 Supervision of high risk pregnancy, unspecified, unspecified trimester: Secondary | ICD-10-CM

## 2017-11-28 LAB — OB RESULTS CONSOLE GBS: GBS: POSITIVE

## 2017-11-28 NOTE — Progress Notes (Signed)
   PRENATAL VISIT NOTE  Subjective:  Ariel Sharp is a 32 y.o. G2P1001 at 7179w2d being seen today for ongoing prenatal care.  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  Patient reports no complaints.  Contractions: Not present. Vag. Bleeding: None.  Movement: Present. Denies leaking of fluid.  She did not bring her sugars today.  The following portions of the patient's history were reviewed and updated as appropriate: allergies, current medications, past family history, past medical history, past social history, past surgical history and problem list. Problem list updated.  Objective:   Vitals:   11/28/17 0858  BP: 110/65  Pulse: 93  Weight: 158 lb 11.2 oz (72 kg)    Fetal Status: Fetal Heart Rate (bpm): 154   Movement: Present     General:  Alert, oriented and cooperative. Patient is in no acute distress.  Skin: Skin is warm and dry. No rash noted.   Cardiovascular: Normal heart rate noted  Respiratory: Normal respiratory effort, no problems with respiration noted  Abdomen: Soft, gravid, appropriate for gestational age.  Pain/Pressure: Present     Pelvic: Cervical exam performed        Extremities: Normal range of motion.  Edema: Trace  Mental Status: Normal mood and affect. Normal behavior. Normal judgment and thought content.   Assessment and Plan:  Pregnancy: G2P1001 at 3579w2d  1. Supervision of high risk pregnancy, antepartum  - Culture, beta strep (group b only) - Cervicovaginal ancillary only  2. 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 Diet controlled gestational diabetes mellitus (GDM) in third trimester - IOL at 39 weeks per MFM - weekly BPP  Preterm labor symptoms and general obstetric  precautions including but not limited to vaginal bleeding, contractions, leaking of fluid and fetal movement were reviewed in detail with the patient. Please refer to After Visit Summary for other counseling recommendations.  No follow-ups on file.  Future Appointments  Date Time Provider Department Center  11/28/2017 10:15 AM WH-MFC US 4 WH-MFCUS MFC-US  12/05/2017  9:45 AM WH-MFC US 2 WH-MFCUS MFC-US  12/12/2017  8:30 AM WH-MFC US 5 WH-MFCUS MFC-US    Allie BossierMyra C Elizabella Nolet, MD

## 2017-12-01 ENCOUNTER — Encounter: Payer: Self-pay | Admitting: Obstetrics & Gynecology

## 2017-12-01 DIAGNOSIS — O99820 Streptococcus B carrier state complicating pregnancy: Secondary | ICD-10-CM | POA: Insufficient documentation

## 2017-12-01 LAB — CULTURE, BETA STREP (GROUP B ONLY): Strep Gp B Culture: POSITIVE — AB

## 2017-12-01 LAB — CERVICOVAGINAL ANCILLARY ONLY
Chlamydia: NEGATIVE
Neisseria Gonorrhea: NEGATIVE

## 2017-12-04 ENCOUNTER — Inpatient Hospital Stay (HOSPITAL_COMMUNITY)
Admission: AD | Admit: 2017-12-04 | Discharge: 2017-12-06 | DRG: 807 | Disposition: A | Discharge status: Home / Self Care | Payer: Medicaid Other | Attending: Internal Medicine | Admitting: Internal Medicine

## 2017-12-04 ENCOUNTER — Encounter (HOSPITAL_COMMUNITY): Payer: Self-pay | Admitting: *Deleted

## 2017-12-04 DIAGNOSIS — O99824 Streptococcus B carrier state complicating childbirth: Secondary | ICD-10-CM | POA: Diagnosis present

## 2017-12-04 DIAGNOSIS — O24420 Gestational diabetes mellitus in childbirth, diet controlled: Secondary | ICD-10-CM | POA: Diagnosis present

## 2017-12-04 DIAGNOSIS — O24419 Gestational diabetes mellitus in pregnancy, unspecified control: Secondary | ICD-10-CM | POA: Diagnosis present

## 2017-12-04 DIAGNOSIS — O24429 Gestational diabetes mellitus in childbirth, unspecified control: Secondary | ICD-10-CM | POA: Diagnosis not present

## 2017-12-04 DIAGNOSIS — O99891 Other specified diseases and conditions complicating pregnancy: Secondary | ICD-10-CM | POA: Diagnosis present

## 2017-12-04 DIAGNOSIS — Z3A37 37 weeks gestation of pregnancy: Secondary | ICD-10-CM

## 2017-12-04 DIAGNOSIS — Z87891 Personal history of nicotine dependence: Secondary | ICD-10-CM

## 2017-12-04 DIAGNOSIS — O99820 Streptococcus B carrier state complicating pregnancy: Secondary | ICD-10-CM

## 2017-12-04 DIAGNOSIS — O0990 Supervision of high risk pregnancy, unspecified, unspecified trimester: Secondary | ICD-10-CM

## 2017-12-04 MED ORDER — WITCH HAZEL-GLYCERIN EX PADS: 1.0000 "application " | MEDICATED_PAD | CUTANEOUS | Status: DC | PRN

## 2017-12-04 MED ORDER — LIDOCAINE HCL (PF) 1 % IJ SOLN
INTRAMUSCULAR | Status: AC
  Filled 2017-12-04: qty 30

## 2017-12-04 MED ORDER — BENZOCAINE-MENTHOL 20-0.5 % EX AERO: 1.0000 "application " | INHALATION_SPRAY | CUTANEOUS | Status: DC | PRN

## 2017-12-04 MED ORDER — SIMETHICONE 80 MG PO CHEW
80.0000 mg | CHEWABLE_TABLET | ORAL | Status: DC | PRN
Start: 2017-12-04 — End: 2017-12-06

## 2017-12-04 MED ORDER — SENNOSIDES-DOCUSATE SODIUM 8.6-50 MG PO TABS
2.0000 | ORAL_TABLET | ORAL | Status: DC
  Administered 2017-12-04: 2 via ORAL
  Filled 2017-12-04 (×2): qty 2

## 2017-12-04 MED ORDER — ONDANSETRON HCL 4 MG/2ML IJ SOLN: 4.0000 mg | INTRAMUSCULAR | Status: DC | PRN

## 2017-12-04 MED ORDER — MEASLES, MUMPS & RUBELLA VAC ~~LOC~~ INJ
0.5000 mL | INJECTION | Freq: Once | SUBCUTANEOUS | Status: DC
  Filled 2017-12-04: qty 0.5

## 2017-12-04 MED ORDER — COCONUT OIL OIL: 1.0000 "application " | TOPICAL_OIL | Status: DC | PRN

## 2017-12-04 MED ORDER — DIPHENHYDRAMINE HCL 25 MG PO CAPS: 25.0000 mg | ORAL_CAPSULE | Freq: Four times a day (QID) | ORAL | Status: DC | PRN

## 2017-12-04 MED ORDER — ACETAMINOPHEN 325 MG PO TABS: 650.0000 mg | ORAL_TABLET | ORAL | Status: DC | PRN

## 2017-12-04 MED ORDER — IBUPROFEN 600 MG PO TABS
600.0000 mg | ORAL_TABLET | Freq: Four times a day (QID) | ORAL | Status: DC
  Administered 2017-12-04 – 2017-12-06 (×8): 600 mg via ORAL
  Filled 2017-12-04 (×8): qty 1

## 2017-12-04 MED ORDER — ZOLPIDEM TARTRATE 5 MG PO TABS: 5.0000 mg | ORAL_TABLET | Freq: Every evening | ORAL | Status: DC | PRN

## 2017-12-04 MED ORDER — OXYTOCIN 40 UNITS IN LACTATED RINGERS INFUSION - SIMPLE MED
INTRAVENOUS | Status: AC
  Administered 2017-12-04: 500 mL/h
  Filled 2017-12-04: qty 1000

## 2017-12-04 MED ORDER — SODIUM CHLORIDE 0.9 % IV SOLN
2.0000 g | Freq: Once | INTRAVENOUS | Status: AC
  Administered 2017-12-04: 2 g via INTRAVENOUS
  Filled 2017-12-04: qty 2

## 2017-12-04 MED ORDER — DIBUCAINE 1 % RE OINT: 1.0000 "application " | TOPICAL_OINTMENT | RECTAL | Status: DC | PRN

## 2017-12-04 MED ORDER — ONDANSETRON HCL 4 MG PO TABS: 4.0000 mg | ORAL_TABLET | ORAL | Status: DC | PRN

## 2017-12-04 MED ORDER — PRENATAL MULTIVITAMIN CH
1.0000 | ORAL_TABLET | Freq: Every day | ORAL | Status: DC
  Administered 2017-12-05 – 2017-12-06 (×2): 1 via ORAL
  Filled 2017-12-04 (×2): qty 1

## 2017-12-04 MED ORDER — TETANUS-DIPHTH-ACELL PERTUSSIS 5-2.5-18.5 LF-MCG/0.5 IM SUSP: 0.5000 mL | Freq: Once | INTRAMUSCULAR | Status: DC

## 2017-12-04 NOTE — MAU Note (Signed)
Pt came in by EMS, states water broke @ 1500, clear fluid, having some bloody show.  Starting having uc's this morning, much more intense since SROM.  Reports good fetal movement.

## 2017-12-04 NOTE — H&P (Signed)
LABOR AND DELIVERY ADMISSION HISTORY AND PHYSICAL NOTE  Ariel Sharp is a 32 y.o. female G2P1001 with IUP at [redacted]w[redacted]d by 6 wk sono presenting for SROM/SOL. States that SROM occurred at 1500 with clear fluid and bloody show. Started having ctx earlier this morning. She reports positive fetal movement.   Prenatal History/Complications: [facility].  Pregnancy complications:  - A1GDM  -UVV   Past Medical History: Past Medical History:  Diagnosis Date  . Preterm labor     Past Surgical History: Past Surgical History:  Procedure Laterality Date  . NO PAST SURGERIES      Obstetrical History: OB History    Gravida  2   Para  1   Term  1   Preterm  0   AB  0   Living  1     SAB      TAB      Ectopic      Multiple      Live Births  1           Social History: Social History   Socioeconomic History  . Marital status: Single    Spouse name: Not on file  . Number of children: Not on file  . Years of education: Not on file  . Highest education level: Not on file  Occupational History  . Not on file  Social Needs  . Financial resource strain: Not on file  . Food insecurity:    Worry: Not on file    Inability: Not on file  . Transportation needs:    Medical: Not on file    Non-medical: Not on file  Tobacco Use  . Smoking status: Former Smoker    Packs/day: 0.50    Last attempt to quit: 05/05/2017    Years since quitting: 0.5  . Smokeless tobacco: Never Used  Substance and Sexual Activity  . Alcohol use: No  . Drug use: Yes    Types: Marijuana    Comment: Previous  . Sexual activity: Never    Birth control/protection: IUD  Lifestyle  . Physical activity:    Days per week: Not on file    Minutes per session: Not on file  . Stress: Not on file  Relationships  . Social connections:    Talks on phone: Not on file    Gets together: Not on file    Attends religious service: Not on file    Active member of club or organization: Not  on file    Attends meetings of clubs or organizations: Not on file    Relationship status: Not on file  Other Topics Concern  . Not on file  Social History Narrative  . Not on file    Family History: Family History  Problem Relation Age of Onset  . Cancer Mother   . Diabetes Maternal Aunt   . Diabetes Maternal Grandmother     Allergies: Allergies  Allergen Reactions  . 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 famotidine (PEPCID) 20 MG tablet Take 1 tablet (20 mg total) by mouth 2 (two) times daily. 60 tablet 3 Past Week at Unknown time  . prenatal vitamin w/FE, FA (PRENATAL 1 + 1) 27-1 MG TABS tablet Take 1 tablet by mouth daily at 12 noon. 30 each 12 Past Week at Unknown time  . promethazine (PHENERGAN) 25 MG tablet Take 1 tablet (25 mg total) by mouth every  6 (six) hours as needed for nausea or vomiting. 30 tablet 2 Past Month at Unknown time  . ACCU-CHEK FASTCLIX LANCETS MISC 1 Device by Percutaneous route 4 (four) times daily. 100 each 12 Taking  . glucose blood (ACCU-CHEK GUIDE) test strip Use as instructed QID 100 each 12 Taking     Review of Systems  All systems reviewed and negative except as stated in HPI  Physical Exam Blood pressure 120/60, pulse 84, temperature (!) 97.5 F (36.4 C), temperature source Oral, resp. rate 20, last menstrual period 03/19/2017. General appearance: alert, oriented, NAD Lungs: normal respiratory effort Heart: regular rate Abdomen: soft, non-tender; gravid, FH appropriate for GA Extremities: No calf swelling or tenderness Presentation: cephalic Fetal monitoring: good variability, +accels  Dilation: (SVD) Effacement (%): 100 Station: -1 Exam by:: m wilkins rnc  Prenatal labs: ABO, Rh: O/Positive/-- (01/31 1621) Antibody: Negative (01/31 1621) Rubella: 1.68 (01/31 1621) RPR: Non Reactive (05/22 0816)  HBsAg: Negative (01/31 1621)  HIV: Non Reactive  (05/22 0816)  GC/Chlamydia: Negative GBS: Positive (07/19 0000)  3-hr GTT: abnormal  Genetic screening:  Low risk NIPs  Anatomy US: Normal   Prenatal Transfer Tool  Maternal Diabetes: Yes:  Diabetes Type:  Diet controlled Genetic Screening: Normal Maternal Ultrasounds/Referrals: Abnormal:  Findings:   Other: UVV, weekly BPPs compelted  Fetal Ultrasounds or other Referrals:  Referred to Materal Fetal Medicine  Maternal Substance Abuse:  No Significant Maternal Medications:  None Significant Maternal Lab Results: Lab values include: Group B Strep positive  No results found for this or any previous visit (from the past 24 hour(s)).  Patient Active Problem List   Diagnosis Date Noted  . GBS (group B Streptococcus carrier), +RV culture, currently pregnant 12/01/2017  . 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 Gestational diabetes 10/02/2017  . 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 Supervision of high risk pregnancy, antepartum 06/12/2017    Assessment: Ariel Sharp is a 32 y.o. G2P1001 at 7678w1d here for SOL/SROM. Pregnancy complicated by A1GDM and UVV.   #Labor: Expectant management. Anticipate quick SVD.  #Pain: Manage with medications prn.  #FWB: Cat I  #ID:  GBS+, start Ampicillin  #MOF: breast #MOC:IUD  #Circ:  N/A #GDM: Check one fasting CBG tomorrow AM. GTT at 4 wk PP follow up.   De HollingsheadCatherine L Elda Dunkerson 12/04/2017, 5:21 PM

## 2017-12-05 ENCOUNTER — Other Ambulatory Visit: Payer: Self-pay

## 2017-12-05 ENCOUNTER — Other Ambulatory Visit (HOSPITAL_COMMUNITY): Payer: Medicaid Other

## 2017-12-05 ENCOUNTER — Encounter: Payer: Medicaid Other | Admitting: Obstetrics & Gynecology

## 2017-12-05 ENCOUNTER — Ambulatory Visit (HOSPITAL_COMMUNITY)
Admission: RE | Admit: 2017-12-05 | Payer: Medicaid Other | Source: Ambulatory Visit | Attending: Obstetrics and Gynecology | Admitting: Obstetrics and Gynecology

## 2017-12-05 ENCOUNTER — Other Ambulatory Visit: Payer: Medicaid Other

## 2017-12-05 LAB — GLUCOSE, CAPILLARY: Glucose-Capillary: 74 mg/dL (ref 70–99)

## 2017-12-05 NOTE — Lactation Note (Signed)
This note was copied from a baby's chart. Lactation Consultation Note Baby 78 hrs old. Mom states baby BF w/o difficulty. Mom BF her 1st child for 7 months. Mom holding baby STS stating has finished BF 20 min. Prior to [facility] entering. Mom has great everted nipples and demonstrated hand expression w/colostrum.  Reviewed newborn feeding habits, STS, I&O, supply and demand. LPI information sheet given for Early Term baby 37 wks 6.0 lbs. Discussed baby will be less than 6 lbs before d/c home and the need to use DEBP for stimulation and supplementation to give back to baby after BF. LC took pump kit and has DEBP in rm. Mom knows to pump q3h for 15-20 min. Asked RN to set up pump if mom wakes up, if not ask on coming RN. Mom states she is tired. Mom encouraged to feed baby 8-12 times/24 hours and with feeding cues. If baby hasn't cued in 3 hrs, stimulate baby to feed. Discussed importance of I&O. Encouraged to call for questions or assistance. [place] brochure given w/resources, support groups and [place] services.  Patient Name: Girl Cyra Spader [id number] Date: 12/05/2017 Reason for consult: Initial assessment;Early term 37-38.6wks   Maternal Data Has patient been taught Hand Expression?: Yes Does the patient have breastfeeding experience prior to this delivery?: Yes  Feeding    LATCH Score       Type of Nipple: Everted at rest and after stimulation  Comfort (Breast/Nipple): Soft / non-tender  Hold (Positioning): No assistance needed to correctly position infant at breast.     Interventions Interventions: Breast feeding basics reviewed;Position options;Skin to skin;Breast massage;Hand express  Lactation Tools Discussed/Used Tools: Pump Breast pump type: Double-Electric Breast Pump WIC Program: No   Consult Status Consult Status: Follow-up Date: 12/06/17 Follow-up type: In-patient    Theodoro Kalata 12/05/2017, 5:33 AM

## 2017-12-05 NOTE — Progress Notes (Addendum)
POSTPARTUM PROGRESS NOTE  Post Partum Day 1 Subjective:  Ariel Sharp is a 32 y.o. [id number] 49w1ds/p svd ppd1.  No acute events overnight.  Pt denies problems with ambulating, voiding or po intake.  She denies nausea or vomiting.  Pain is well controlled, and pt reports she only experiences discomfort sporadically when breastfeeding. Lochia Small. Pt reports that she noticed a few clots but she endorses  saturating  her pad about 3 times in the last 24 hours.   Objective: Patient Vitals for the past 24 hrs:  BP Temp Temp src Pulse Resp  12/05/17 0422 103/72 98.7 F (37.1 C) Oral (!) 58 18  12/04/17 2320 104/64 98.6 F (37 C) Oral 69 18  12/04/17 1828 104/61 98.1 F (36.7 C) Oral (!) 53 16  12/04/17 1741 (!) 100/58 97.7 F (36.5 C) Oral 64 16  12/04/17 1716 107/70 - - 78 -  12/04/17 1701 (!) 102/59 - - 81 -  12/04/17 1646 113/65 - - 87 -  12/04/17 1631 101/71 - - 93 -  12/04/17 1630 (!) 96/58 - - 85 -  12/04/17 1540 120/60 (!) 97.5 F (36.4 C) Oral 84 20   last menstrual period 03/19/2017, unknown if currently breastfeeding.   Physical Exam:  General: alert, cooperative and no distress Lochia:normal flow Chest: no respiratory distress, CTAB Heart:regular rate, no M/R/G Abdomen: soft, nontender,   Uterine Fundus: firm, no discomfort when palpating DVT Evaluation: No calf swelling or tenderness Extremities: no edema  Assessment/Plan:  ASSESSMENT: Ariel VOKESis a 32y.o. [id number]/p svd ppd1 reovering well with minimial lochia and no complaints of abdominal  discomfort outside of breastfeeding.    Plan for discharge tomorrow, Breastfeeding and Contraception IUD at postpartum follow up visit in 6 weeks.   LOS: 1 day   ChLoel DubonnetS3 12/05/2017, 7:32 AM   I have spoken with and examined this patient and agree with resident/PA-S/Med-S/SNM's note and plan of care. VSS, HRR&R, Resp unlabored, Legs neg.  FrNigel BertholdCNM 12/05/2017 7:48 AM

## 2017-12-06 MED ORDER — IBUPROFEN 600 MG PO TABS: 600.0000 mg | ORAL_TABLET | Freq: Four times a day (QID) | ORAL | 0 refills | Status: DC

## 2017-12-06 NOTE — Discharge Summary (Addendum)
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 yo [id number] at [redacted]w[redacted]d was admitted in Active Labor on 12/04/2017. Patient had an uncomplicated labor course as follows:  Membrane Rupture Time/Date:   ,    Intrapartum Procedures: Episiotomy: None [1]                                         Lacerations:  None [1]  Patient had a delivery of a Viable infant. 12/04/2017  Information for the patient's newborn:  Ariel, Sharp [[id number]]  Delivery Method: Vaginal, Spontaneous(Filed from Delivery Summary)    Pateint had an uncomplicated postpartum course.  She is ambulating, tolerating a regular diet, passing flatus, and urinating well. Patient is discharged home in stable condition on 12/06/17.   Physical exam  Vitals:   12/05/17 1345 12/05/17 1500 12/05/17 2319 12/06/17 0619  BP: 114/70  108/71 (!) 97/58  Pulse: 84  65 62  Resp: 18   18  Temp: 97.7 F (36.5 C)  98.4 F (36.9 C) 98.2 F (36.8 C)  TempSrc: Oral  Oral Oral  SpO2: 97%   99%  Weight:  72.7 kg (160 lb 3.2 oz)    Height:  5\' 8"  (1.727 m)     General: alert, cooperative and no distress Lochia:  appropriate Uterine Fundus: firm Incision: N/A DVT Evaluation: No evidence of DVT seen on physical exam. Labs: Lab Results  Component Value Date   WBC 11.4 (H) 10/01/2017   HGB 11.2 10/01/2017   HCT 33.9 (L) 10/01/2017   MCV 96 10/01/2017   PLT 233 10/01/2017   CMP Latest Ref Rng & Units 02/27/2017  Glucose 65 - 99 mg/dL 84  BUN 6 - 20 mg/dL 14  Creatinine 1.30 - 8.65 mg/dL 7.84  Sodium 696 - 295 mmol/L 134(L)  Potassium 3.5 - 5.1 mmol/L 3.8  Chloride 101 - 111 mmol/L 102  CO2 22 - 32 mmol/L 25  Calcium 8.9 - 10.3 mg/dL 2.8(U)    Discharge instruction: per After Visit Summary and "Baby and Me Booklet".  After visit meds:  Allergies as of 12/06/2017      Reactions  Shrimp [shellfish Allergy] Anaphylaxis      Medication List    STOP taking these medications   ACCU-CHEK FASTCLIX LANCETS Misc   famotidine 20 MG tablet Commonly known as:  PEPCID   glucose blood test strip Commonly known as:  ACCU-CHEK GUIDE   prenatal vitamin w/FE, FA 27-1 MG Tabs tablet   promethazine 25 MG tablet Commonly known as:  PHENERGAN     TAKE these medications   ibuprofen 600 MG tablet Commonly known as:  ADVIL,MOTRIN Take 1 tablet (600 mg total) by mouth every 6 (six) hours.       Diet: routine diet  Activity: Advance as tolerated. Pelvic rest for 6 weeks.   Outpatient follow up:4-6 weeks  Postpartum contraception: IUD    Newborn Data: Live born female  Birth Weight: 6 lb 0.1 oz (2724 g) APGAR: 9, 9  Newborn Delivery   Birth date/time:  12/04/2017 16:20:00 Delivery type:  Vaginal, Spontaneous     Baby Feeding: Breast Disposition:home with mother   12/06/2017 Ariel Kittyaniel K Olson, MD  OB FELLOW DISCHARGE ATTESTATION  I have seen and examined this patient and agree with above documentation in the resident's note.   Pt will need 2-hr GTT at postpartum visit  Ariel PearJulie P Tajanae Guilbault, MD OB Fellow 12/06/2017

## 2017-12-06 NOTE — Discharge Instructions (Signed)
Vaginal Delivery, Care After °Refer to this sheet in the next few weeks. These instructions provide you with information about caring for yourself after vaginal delivery. Your health care provider may also give you more specific instructions. Your treatment has been planned according to current medical practices, but problems sometimes occur. Call your health care provider if you have any problems or questions. °What can I expect after the procedure? °After vaginal delivery, it is common to have: °· Some bleeding from your vagina. °· Soreness in your abdomen, your vagina, and the area of skin between your vaginal opening and your anus (perineum). °· Pelvic cramps. °· Fatigue. ° °Follow these instructions at home: °Medicines °· Take over-the-counter and prescription medicines only as told by your health care provider. °· If you were prescribed an antibiotic medicine, take it as told by your health care provider. Do not stop taking the antibiotic until it is finished. °Driving ° °· Do not drive or operate heavy machinery while taking prescription pain medicine. °· Do not drive for 24 hours if you received a sedative. °Lifestyle °· Do not drink alcohol. This is especially important if you are breastfeeding or taking medicine to relieve pain. °· Do not use tobacco products, including cigarettes, chewing tobacco, or e-cigarettes. If you need help quitting, ask your health care provider. °Eating and drinking °· Drink at least 8 eight-ounce glasses of water every day unless you are told not to by your health care provider. If you choose to breastfeed your baby, you may need to drink more water than this. °· Eat high-fiber foods every day. These foods may help prevent or relieve constipation. High-fiber foods include: °? Whole grain cereals and breads. °? Brown rice. °? Beans. °? Fresh fruits and vegetables. °Activity °· Return to your normal activities as told by your health care provider. Ask your health care provider  what activities are safe for you. °· Rest as much as possible. Try to rest or take a nap when your baby is sleeping. °· Do not lift anything that is heavier than your baby or 10 lb (4.5 kg) until your health care provider says that it is safe. °· Talk with your health care provider about when you can engage in sexual activity. This may depend on your: °? Risk of infection. °? Rate of healing. °? Comfort and desire to engage in sexual activity. °Vaginal Care °· If you have an episiotomy or a vaginal tear, check the area every day for signs of infection. Check for: °? More redness, swelling, or pain. °? More fluid or blood. °? Warmth. °? Pus or a bad smell. °· Do not use tampons or douches until your health care provider says this is safe. °· Watch for any blood clots that may pass from your vagina. These may look like clumps of dark red, brown, or black discharge. °General instructions °· Keep your perineum clean and dry as told by your health care provider. °· Wear loose, comfortable clothing. °· Wipe from front to back when you use the toilet. °· Ask your health care provider if you can shower or take a bath. If you had an episiotomy or a perineal tear during labor and delivery, your health care provider may tell you not to take baths for a certain length of time. °· Wear a bra that supports your breasts and fits you well. °· If possible, have someone help you with household activities and help care for your baby for at least a few days after   you leave the hospital. °· Keep all follow-up visits for you and your baby as told by your health care provider. This is important. °Contact a health care provider if: °· You have: °? Vaginal discharge that has a bad smell. °? Difficulty urinating. °? Pain when urinating. °? A sudden increase or decrease in the frequency of your bowel movements. °? More redness, swelling, or pain around your episiotomy or vaginal tear. °? More fluid or blood coming from your episiotomy or  vaginal tear. °? Pus or a bad smell coming from your episiotomy or vaginal tear. °? A fever. °? A rash. °? Little or no interest in activities you used to enjoy. °? Questions about caring for yourself or your baby. °· Your episiotomy or vaginal tear feels warm to the touch. °· Your episiotomy or vaginal tear is separating or does not appear to be healing. °· Your breasts are painful, hard, or turn red. °· You feel unusually sad or worried. °· You feel nauseous or you vomit. °· You pass large blood clots from your vagina. If you pass a blood clot from your vagina, save it to show to your health care provider. Do not flush blood clots down the toilet without having your health care provider look at them. °· You urinate more than usual. °· You are dizzy or light-headed. °· You have not breastfed at all and you have not had a menstrual period for 12 weeks after delivery. °· You have stopped breastfeeding and you have not had a menstrual period for 12 weeks after you stopped breastfeeding. °Get help right away if: °· You have: °? Pain that does not go away or does not get better with medicine. °? Chest pain. °? Difficulty breathing. °? Blurred vision or spots in your vision. °? Thoughts about hurting yourself or your baby. °· You develop pain in your abdomen or in one of your legs. °· You develop a severe headache. °· You faint. °· You bleed from your vagina so much that you fill two sanitary pads in one hour. °This information is not intended to replace advice given to you by your health care provider. Make sure you discuss any questions you have with your health care provider. °Document Released: 04/26/2000 Document Revised: 10/11/2015 Document Reviewed: 05/14/2015 °Elsevier Interactive Patient Education © 2018 [employer] ° °

## 2017-12-06 NOTE — Lactation Note (Addendum)
This note was copied from a baby's chart. Lactation Consultation Note  Patient Name: Ariel Sharp OVFIE'PToday's Date: 12/06/2017 Reason for consult: Follow-up assessment;Early term 37-38.6wks;Infant < 6lbs   Baby 41 hours old.  Baby 5 lb 8.2 oz.  9069w1d Reviewed hand expression w/ mother with good flow of colostrum before latching. Mother latched baby in cradle hold.  Encouraged breast compression to keep her active. Recommend mother hand express or post pump and give baby back additional volume at each feeding via spoon. Discussed cleaning and milk storage. Provided mother w/ manual pump and demonstrated how to convert DEBP to manual. Mom encouraged to feed baby 8-12 times/24 hours and with feeding cues at least q 3 hours.  Reviewed engorgement care and monitoring voids/stools.     Maternal Data    Feeding Feeding Type: Breast Fed Length of feed: 50 min  LATCH Score Latch: Grasps breast easily, tongue down, lips flanged, rhythmical sucking.  Audible Swallowing: A few with stimulation  Type of Nipple: Everted at rest and after stimulation  Comfort (Breast/Nipple): Soft / non-tender  Hold (Positioning): Assistance needed to correctly position infant at breast and maintain latch.  LATCH Score: 8  Interventions Interventions: Assisted with latch;Hand express;Breast compression;Hand pump;DEBP  Lactation Tools Discussed/Used     Consult Status Consult Status: Follow-up Date: 12/07/17 Follow-up type: In-patient    Dahlia ByesBerkelhammer, [facility] 12/06/2017, 10:13 AM

## 2017-12-07 LAB — RPR: RPR Ser Ql: NONREACTIVE

## 2017-12-12 ENCOUNTER — Other Ambulatory Visit: Payer: Medicaid Other

## 2017-12-12 ENCOUNTER — Other Ambulatory Visit (HOSPITAL_COMMUNITY): Payer: Medicaid Other

## 2017-12-12 ENCOUNTER — Encounter: Payer: Medicaid Other | Admitting: Obstetrics and Gynecology

## 2017-12-12 ENCOUNTER — Ambulatory Visit (HOSPITAL_COMMUNITY): Payer: Medicaid Other

## 2018-01-09 ENCOUNTER — Ambulatory Visit (INDEPENDENT_AMBULATORY_CARE_PROVIDER_SITE_OTHER): Payer: Medicaid Other | Admitting: Obstetrics and Gynecology

## 2018-01-09 ENCOUNTER — Other Ambulatory Visit (HOSPITAL_COMMUNITY)
Admission: RE | Admit: 2018-01-09 | Discharge: 2018-01-09 | Disposition: A | Discharge status: Home / Self Care | Payer: Medicaid Other | Source: Ambulatory Visit | Attending: Obstetrics and Gynecology | Admitting: Obstetrics and Gynecology

## 2018-01-09 ENCOUNTER — Encounter: Payer: Self-pay | Admitting: Obstetrics and Gynecology

## 2018-01-09 VITALS — BP 98/73 | HR 95 | Ht 68.0 in | Wt 134.4 lb

## 2018-01-09 DIAGNOSIS — Z1389 Encounter for screening for other disorder: Secondary | ICD-10-CM | POA: Diagnosis not present

## 2018-01-09 DIAGNOSIS — Z30430 Encounter for insertion of intrauterine contraceptive device: Secondary | ICD-10-CM

## 2018-01-09 MED ORDER — LEVONORGESTREL 19.5 MCG/DAY IU IUD
INTRAUTERINE_SYSTEM | Freq: Once | INTRAUTERINE | Status: AC
Start: 2018-01-09 — End: 2018-01-09
  Administered 2018-01-09: 12:00:00 via INTRAUTERINE

## 2018-01-09 NOTE — Progress Notes (Signed)
Subjective:     Ariel Sharp is a 32 y.o. female who presents for a postpartum visit. She is 6 weeks postpartum following a spontaneous vaginal delivery on 12/04/17. I have fully reviewed the prenatal and intrapartum course. The delivery was at 37.1 gestational weeks. Outcome: spontaneous vaginal delivery. Anesthesia: none. Postpartum course has been unremarkable. Baby's course has been unremarkable . Baby is feeding by breast. Bleeding staining only. Bowel function is normal. Bladder function is normal. Patient is not sexually active. Contraception method is IUD. Postpartum depression screening: negative.  The following portions of the patient's history were reviewed and updated as appropriate: allergies, current medications, past family history, past medical history, past social history, past surgical history and problem list.  Review of Systems Pertinent items are noted in HPI.   Objective:    BP 98/73   Pulse 95   Ht 5\' 8"  (1.727 m)   Wt 134 lb 6.4 oz (61 kg)   LMP 03/19/2017 (Exact Date)   BMI 20.44 kg/m   General:  alert  Lungs: clear to auscultation bilaterally  Heart:  regular rate and rhythm, S1, S2 normal, no murmur, click, rub or gallop  Abdomen: soft, non-tender; bowel sounds normal; no masses,  no organomegaly   Vulva:  normal  Vagina: normal vagina  Cervix:  anteverted  Corpus: normal size, contour, position, consistency, mobility, non-tender  Adnexa:  normal adnexa  Rectal Exam: Not performed.        Assessment:    Normal postpartum exam. Pap smear done at today's visit.   Plan:   1. Contraception: IUD 2. Schedule PP 2 hour next week ASAP, patient unable to wait today. 3. Follow up in: 4 weeks for string check. Difficult insertion. Call the office with worsening pain.          GYNECOLOGY CLINIC PROCEDURE NOTE  Ariel Sharp is a 32 y.o. [phone] here for Lyletta IUD insertion. No GYN concerns.  Last pap smear was on 2017 and was normal. Pap done  today.  IUD Insertion Procedure Note Patient identified, informed consent performed, consent signed.   Discussed risks of irregular bleeding, cramping, infection, malpositioning or misplacement of the IUD outside the uterus which may require further procedure such as laparoscopy. Time out was performed.  Urine pregnancy test negative.  Speculum placed in the vagina.  Cervix visualized.  Cleaned with Betadine x 2.  Grasped anteriorly with a single tooth tenaculum.  Uterus sounded to 7 cm.  Lyletta  IUD placed per manufacturer's recommendations.  Strings trimmed to 3 cm. Tenaculum was removed, good hemostasis noted.  Patient tolerated procedure well. Difficult insertion due to cervix position.   Patient was given post-procedure instructions.  She was advised to have backup contraception for one week.  Patient was also asked to check IUD strings periodically and follow up in 4 weeks for IUD check.  Venia Carbonasch, Jennifer I, NP 01/09/2018 11:21 AM

## 2018-01-13 LAB — POCT PREGNANCY, URINE: Preg Test, Ur: NEGATIVE

## 2018-01-14 LAB — CYTOLOGY - PAP
Chlamydia: NEGATIVE
Diagnosis: NEGATIVE
HPV: NOT DETECTED
Neisseria Gonorrhea: NEGATIVE

## 2018-01-16 ENCOUNTER — Other Ambulatory Visit: Payer: Medicaid Other

## 2018-01-16 ENCOUNTER — Other Ambulatory Visit: Payer: Self-pay

## 2018-01-16 ENCOUNTER — Other Ambulatory Visit: Payer: Self-pay | Admitting: General Practice

## 2018-01-16 DIAGNOSIS — O24439 Gestational diabetes mellitus in the puerperium, unspecified control: Secondary | ICD-10-CM

## 2018-01-16 NOTE — Progress Notes (Unsigned)
lab

## 2018-01-28 ENCOUNTER — Encounter: Payer: Self-pay | Admitting: *Deleted

## 2018-02-06 ENCOUNTER — Ambulatory Visit (INDEPENDENT_AMBULATORY_CARE_PROVIDER_SITE_OTHER): Payer: Medicaid Other

## 2018-02-06 VITALS — BP 104/68 | HR 85 | Ht 68.0 in | Wt 135.1 lb

## 2018-02-06 DIAGNOSIS — Z975 Presence of (intrauterine) contraceptive device: Secondary | ICD-10-CM

## 2018-02-06 DIAGNOSIS — Z30431 Encounter for routine checking of intrauterine contraceptive device: Secondary | ICD-10-CM | POA: Diagnosis not present

## 2018-02-06 NOTE — Progress Notes (Signed)
History:  [name] is a 32 y.o. [id number] who presents to clinic today for IUD string check. Reports intermittent spotting since placement, no pain.  Patient was not able to come for her PP GTT and is unable to stay today.    The following portions of the patient's history were reviewed and updated as appropriate: allergies, current medications, family history, past medical history, social history, past surgical history and problem list.  Review of Systems:  Review of Systems  Constitutional: Negative.  Negative for chills and fever.  Respiratory: Negative.   Cardiovascular: Negative.  Negative for chest pain.  Genitourinary: Negative.        Spotting  Neurological: Negative.  Negative for dizziness and headaches.      Objective:  Physical Exam BP 104/68   Pulse 85   Ht 5\' 8"  (1.727 m)   Wt 135 lb 1.6 oz (61.3 kg)   LMP 03/19/2017 (Exact Date)   BMI 20.54 kg/m  Physical Exam  Constitutional: She is oriented to person, place, and time. She appears well-developed and well-nourished. No distress.  HENT:  Head: Normocephalic.  Eyes: Pupils are equal, round, and reactive to light.  Neck: Normal range of motion.  Cardiovascular: Normal rate and regular rhythm.  Pulmonary/Chest: Effort normal and breath sounds normal. No respiratory distress.  Abdominal: Soft. There is no tenderness.  Genitourinary:  Genitourinary Comments: IUD strings visualized in cervix  Musculoskeletal: Normal range of motion.  Neurological: She is alert and oriented to person, place, and time.  Skin: Skin is warm and dry.  Psychiatric: She has a normal mood and affect. Her behavior is normal. Judgment and thought content normal.  Nursing note and vitals reviewed.   Assessment & Plan:   1. IUD (intrauterine device) in place    -Bleeding precautions reviewed. -Stressed importance of rescheduling GTT, patient agreeable to plan.  -Patient to follow up for annual exam.   Rolm Bookbinder,  CNM 02/06/2018 10:06 AM

## 2019-10-05 IMAGING — US US OB COMP LESS 14 WK
1 series · 15 of 28 positions shown · non-contrast
Comparison: None.

CLINICAL DATA: IUD failure, pregnant O99.89, [id number] (09X-MN-CM)
Pregnancy with inconclusive fetal viability, single or unspecified
fetus [id number] (09X-MN-CM)

EXAM:
OBSTETRIC <14 WK US AND TRANSVAGINAL OB US
TECHNIQUE: Both transabdominal and transvaginal ultrasound examinations were
performed for complete evaluation of the gestation as well as the
maternal uterus, adnexal regions, and pelvic cul-de-sac.
Transvaginal technique was performed to assess early pregnancy.

[Series 1: us ob comp less 14 wk · 38 acquisitions, 15 frames shown]
[im 1/38]
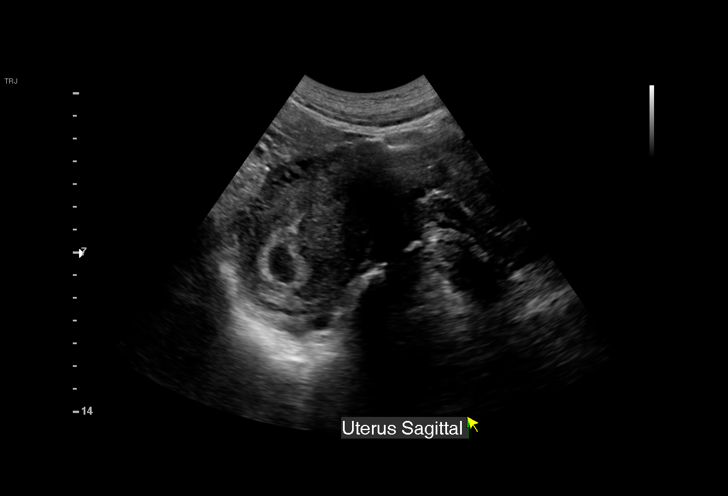
[im 3/38]
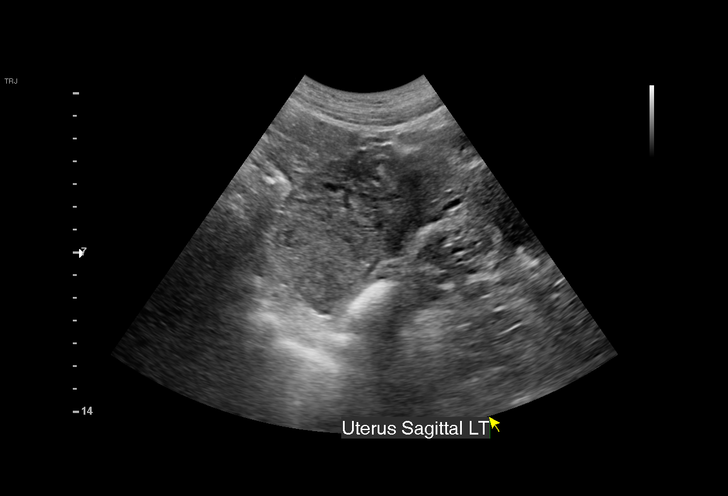
[im 6/38]
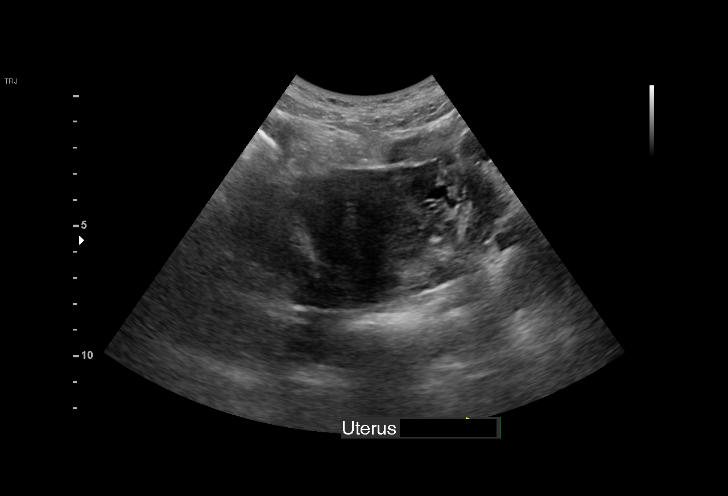
[im 9/38]
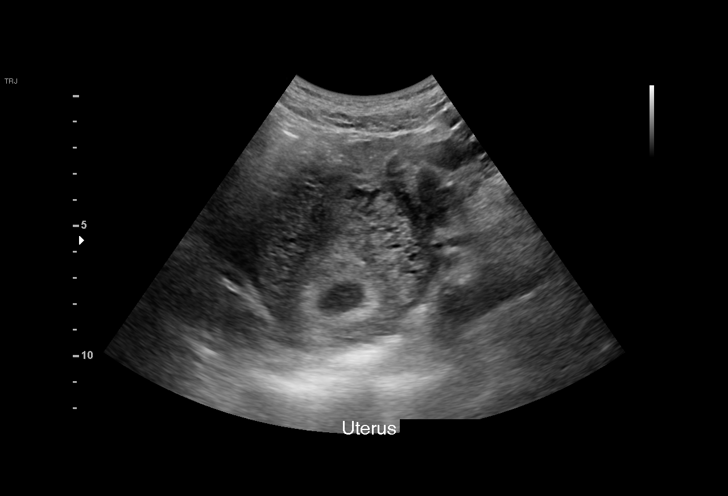
[im 11/38]
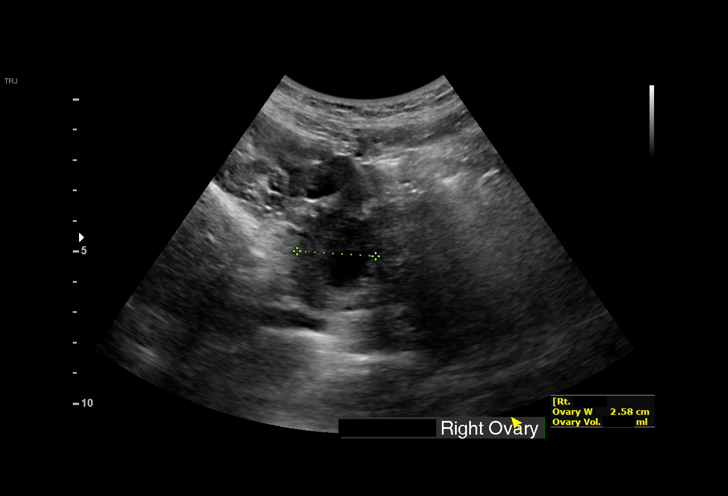
[im 14/38]
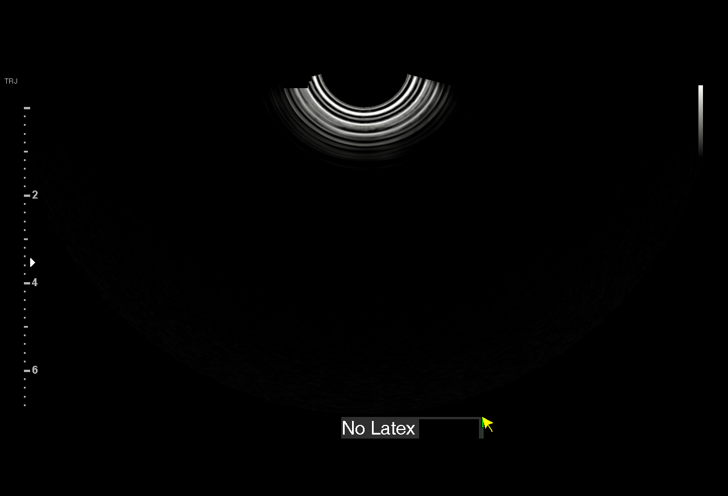
[im 17/38]
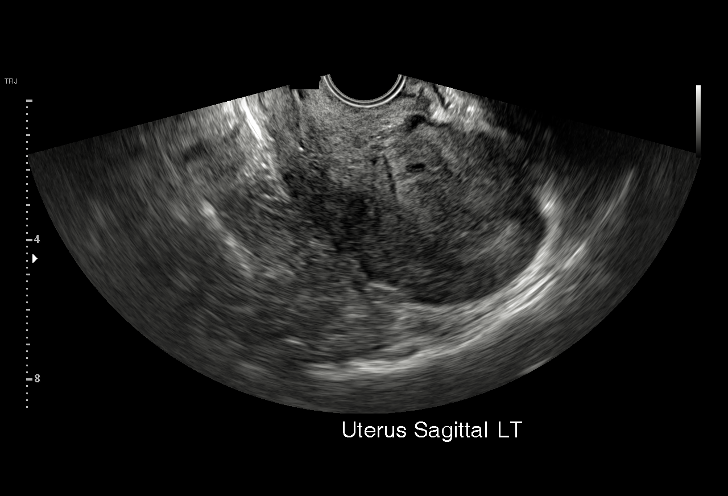
[im 20/38]
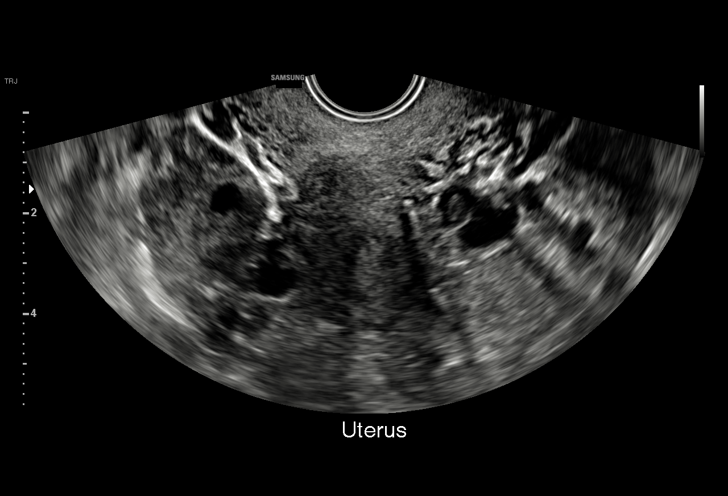
[im 21/38]
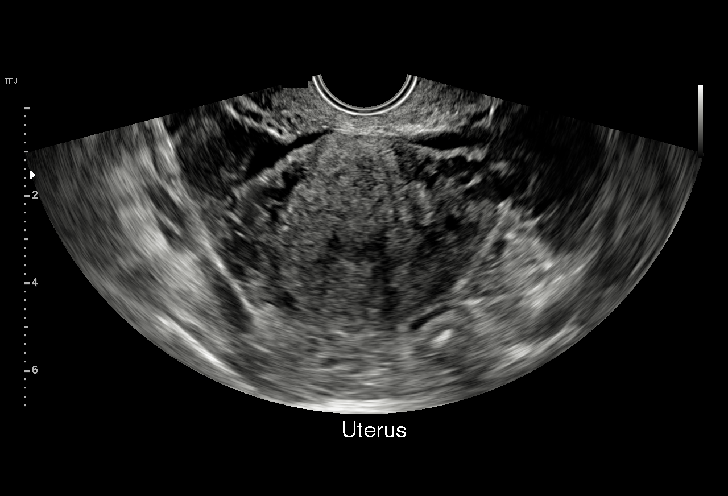
[im 24/38]
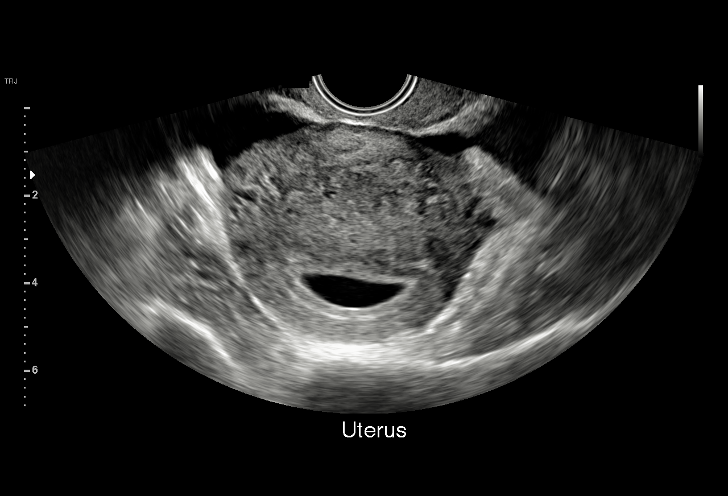
[im 27/38]
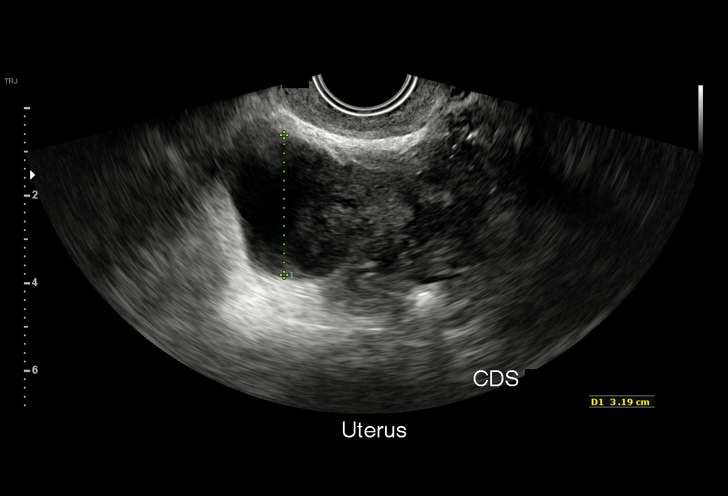
[im 29/38]
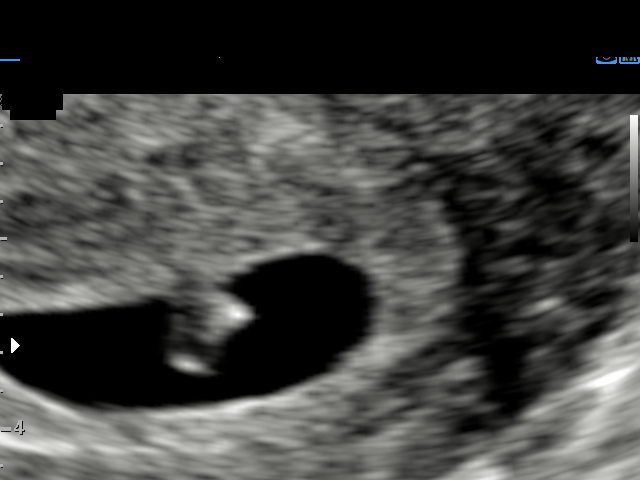
[im 32/38]
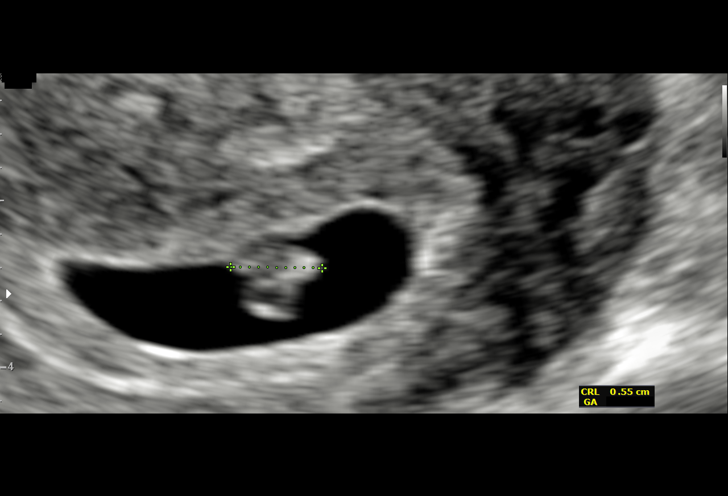
[im 35/38]
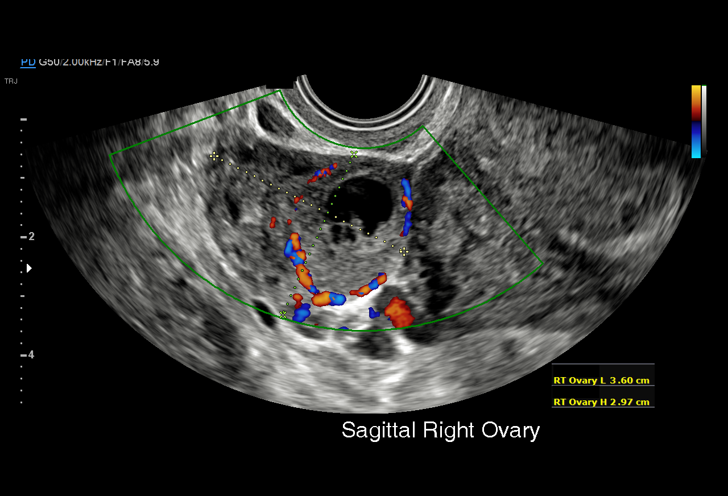
[im 38/38]
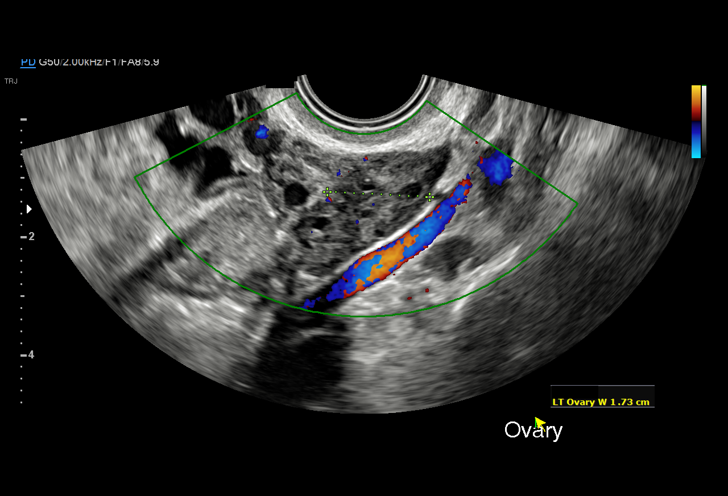

[15 of 28 positions shown; findings below may reference images not displayed]

FINDINGS: Intrauterine gestational sac: Single

Yolk sac:  Visualized.

Embryo:  Visualized.

Cardiac Activity: Visualized.

Heart Rate: 119  bpm

CRL:  5.6  mm   6 w   2 d                  US EDC: 12/24/2017

Subchorionic hemorrhage:  None visualized.

Maternal uterus/adnexae: No uterine masses. Cervix is unremarkable.
Ovaries and adnexa are unremarkable.

Small amount pelvic free fluid.
IMPRESSION: 1. Single live anterior pregnancy with a measured gestational age of
6 weeks and 2 days.
2. No pregnancy complication.
3. Small amount pelvic free fluid which may be physiologic.

## 2020-01-02 IMAGING — US US MFM OB COMP +14 WKS
1 series · 14 of 28 positions shown · non-contrast
Comparison: none

[Series 1: us mfm ob comp +14 wks · 97 acquisitions, 14 frames shown]
[im 4/97]
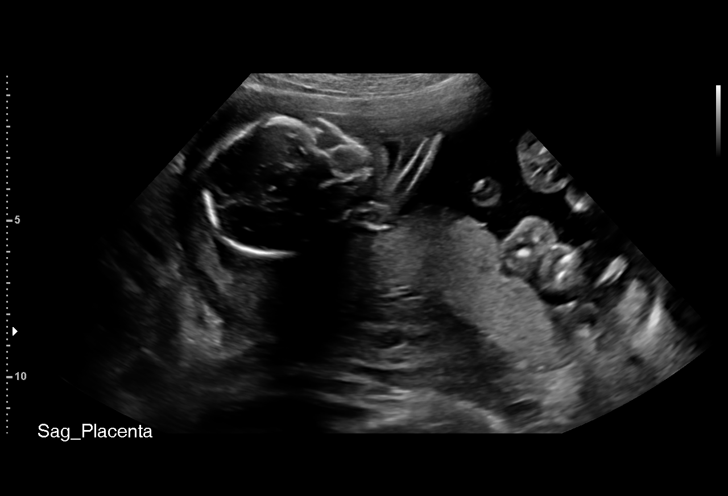
[im 11/97]
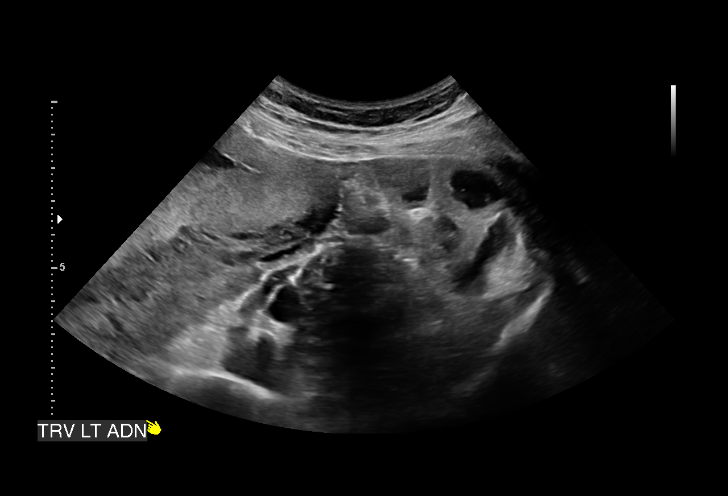
[im 18/97]
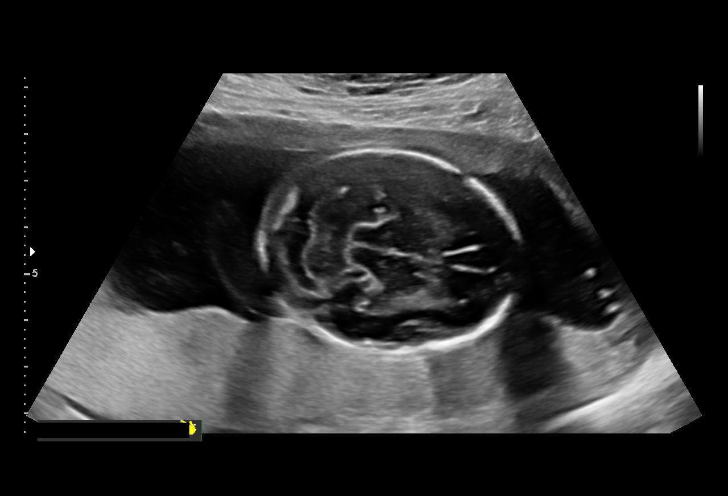
[im 25/97]
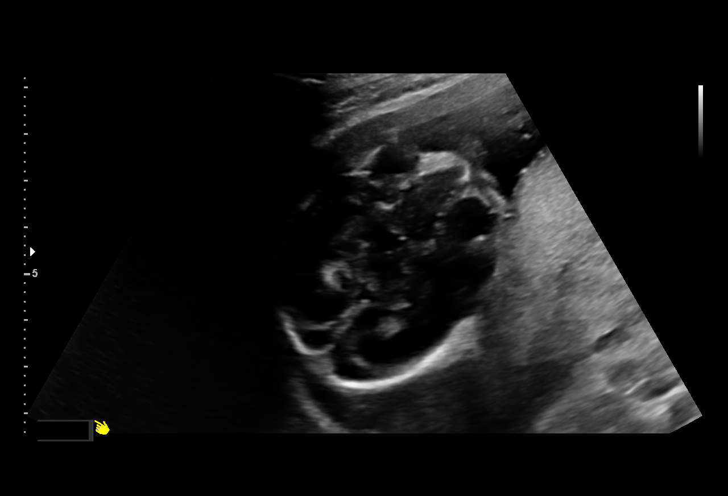
[im 33/97]
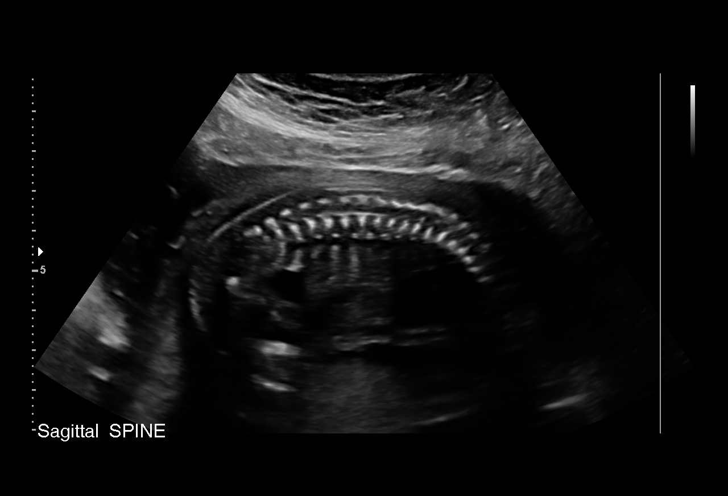
[im 40/97]
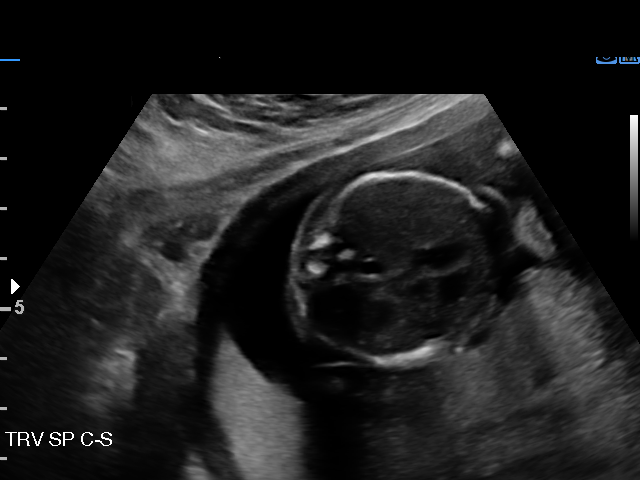
[im 47/97]
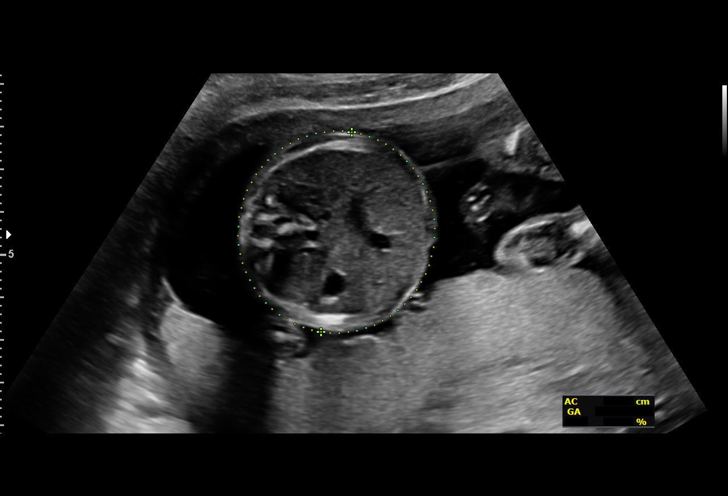
[im 54/97]
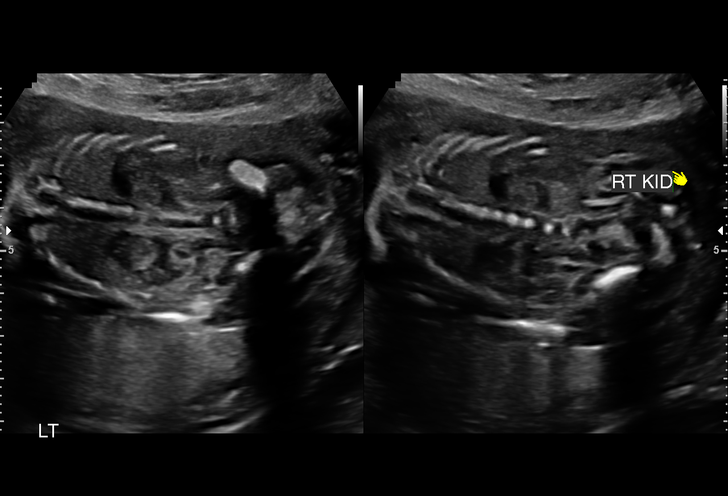
[im 61/97]
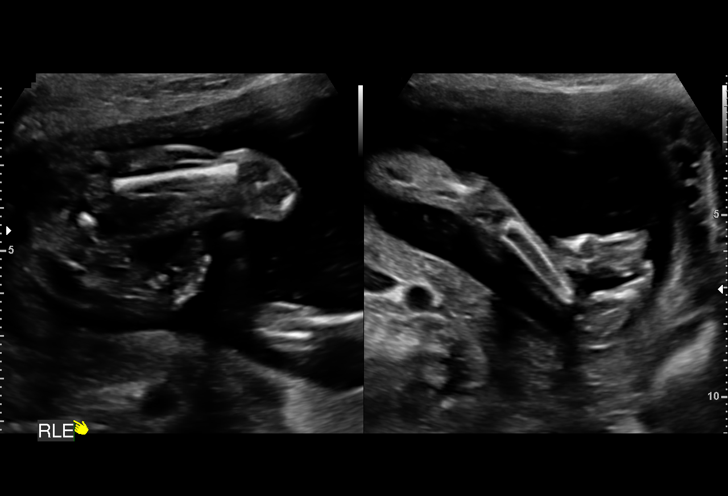
[im 68/97]
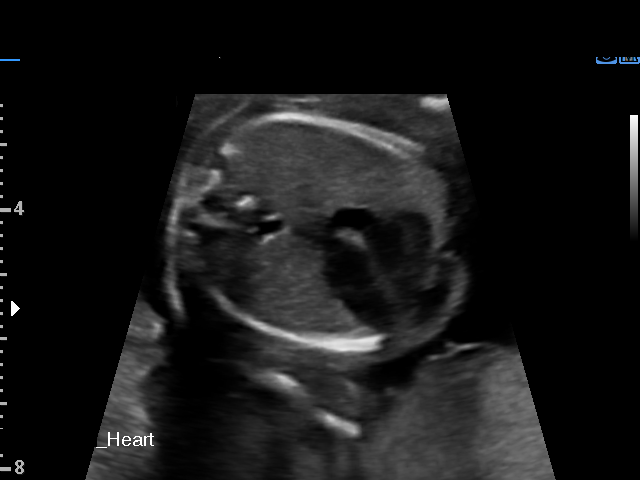
[im 75/97]
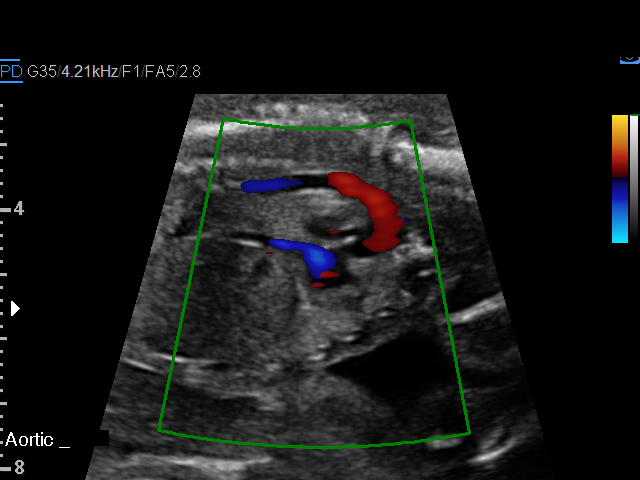
[im 82/97]
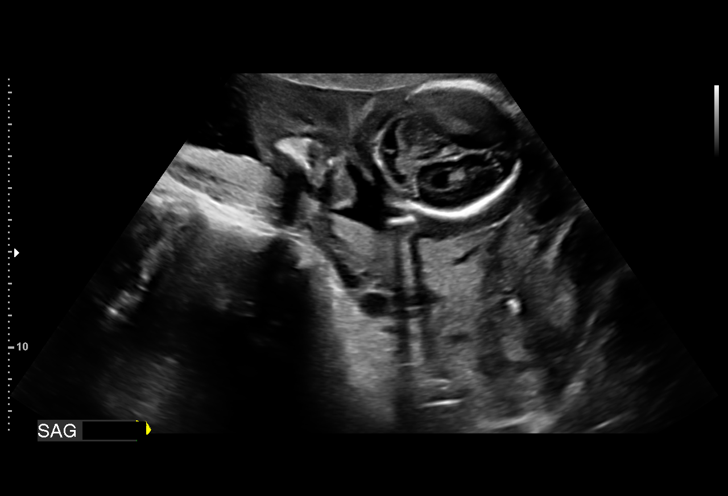
[im 89/97]
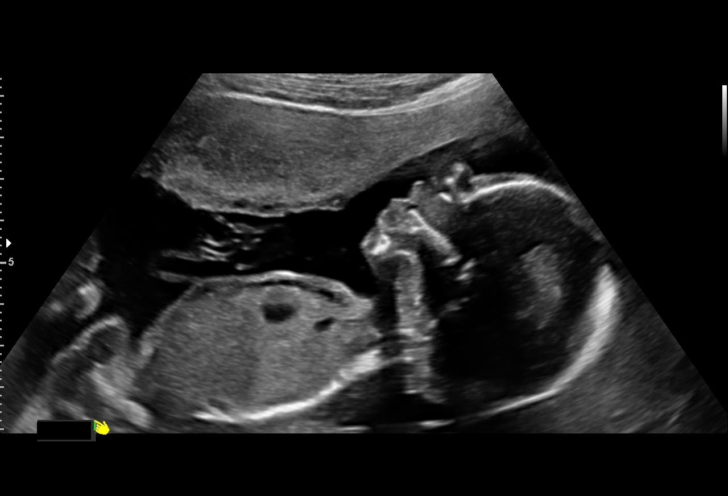
[im 97/97]
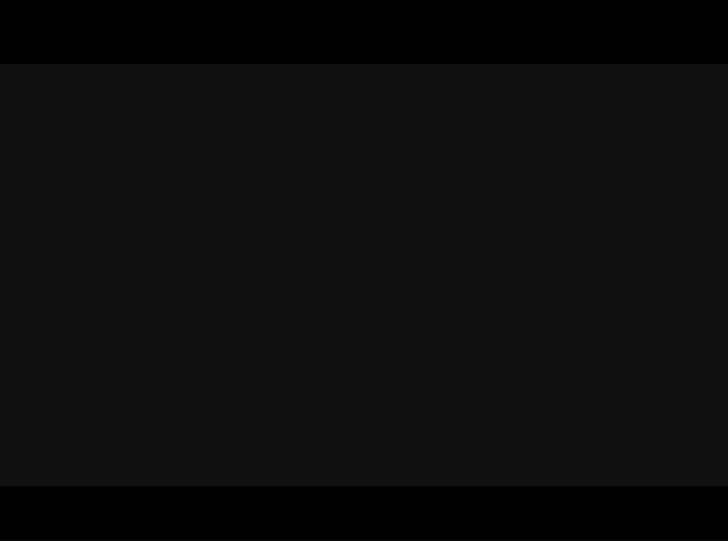

[14 of 28 positions shown; findings below may reference images not displayed]

OB/Gyn Clinic

1  SANGTEI JEBA           [id number]      [id number]     [id number]
Indications

19 weeks gestation of pregnancy
Encounter for fetal anatomic survey (Low
risk NIPS)
OB History

Gravidity:    2         Term:   1        Prem:   0        SAB:   0
TOP:          0       Ectopic:  0        Living: 1
Fetal Evaluation

Num Of Fetuses:     1
Fetal Heart         161
Rate(bpm):
Cardiac Activity:   Observed
Presentation:       Transverse, head to maternal left
Placenta:           Posterior Previa
P. Cord Insertion:  Visualized

Amniotic Fluid
AFI FV:      Subjectively within normal limits

Largest Pocket(cm)
4.3
Biometry
BPD:      42.3  mm     G. Age:  18w 6d         42  %    CI:        71.57   %    70 - 86
FL/HC:      18.9   %    16.1 -
HC:      159.2  mm     G. Age:  18w 6d         32  %    HC/AC:      1.09        1.09 -
AC:      145.8  mm     G. Age:  19w 6d         75  %    FL/BPD:     71.2   %
FL:       30.1  mm     G. Age:  19w 2d         54  %    FL/AC:      20.6   %    20 - 24
HUM:      28.8  mm     G. Age:  19w 2d         59  %
CER:      18.9  mm     G. Age:  18w 3d         34  %
NFT:       2.8  mm
CM:        6.9  mm

Est. FW:     296  gm    0 lb 10 oz      53  %
Gestational Age

U/S Today:     19w 2d                                        EDD:   12/22/17
Best:          19w 0d     Det. By:  Early Ultrasound         EDD:   12/24/17
(05/02/17)
Anatomy

Cranium:               Appears normal         LVOT:                   Appears normal
Cavum:                 Appears normal         Aortic Arch:            Appears normal
Ventricles:            Appears normal         Ductal Arch:            Appears normal
Choroid Plexus:        Appears normal         Diaphragm:              Appears normal
Cerebellum:            Appears normal         Stomach:                Appears normal, left
sided
Posterior Fossa:       Appears normal         Abdomen:                Appears normal
Nuchal Fold:           Appears normal         Abdominal Wall:         Appears nml (cord
insert, abd wall)
Face:                  Appears normal         Cord Vessels:           Appears normal (3
(orbits and profile)                           vessel cord)
Lips:                  Appears normal         Kidneys:                Appear normal
Palate:                Not well visualized    Bladder:                Appears normal
Thoracic:              Appears normal         Spine:                  Appears normal
Heart:                 Appears normal         Upper Extremities:      Visualized
(4CH, axis, and situs
RVOT:                  Appears normal         Lower Extremities:      Visualized

Other:  Fetus appears to be a female. Heels visualized. Nasal bone
visualized.
Cervix Uterus Adnexa

Cervix
Length:            3.3  cm.
Normal appearance by transabdominal scan.

Left Ovary
Not visualized.

Right Ovary
Within normal limits.

Adnexa:       No abnormality visualized.
Impression

Singleton intrauterine pregnancy at 19+0 weeks here for
anatomic survey
There is a posterior partial previa
Review of the anatomy shows no sonographic markers for
aneuploidy or structural anomalies
However, views of the fetal palate should be considered
suboptimal secondary to fetal position
Amniotic fluid volume is normal
Estimated fetal weight shows growth in the 53rd percentile
Recommendations

Recommend repeat valuation for placental position and an
attempt to visualize fetal palate in 6 weeks.

## 2020-10-04 ENCOUNTER — Ambulatory Visit (HOSPITAL_COMMUNITY)
Admission: EM | Admit: 2020-10-04 | Discharge: 2020-10-04 | Disposition: A | Discharge status: Home / Self Care | Payer: Medicaid Other | Attending: Family Medicine | Admitting: Family Medicine

## 2020-10-04 ENCOUNTER — Encounter (HOSPITAL_COMMUNITY): Payer: Self-pay

## 2020-10-04 ENCOUNTER — Other Ambulatory Visit: Payer: Self-pay

## 2020-10-04 DIAGNOSIS — N764 Abscess of vulva: Secondary | ICD-10-CM

## 2020-10-04 MED ORDER — CEPHALEXIN 500 MG PO CAPS: 500.0000 mg | ORAL_CAPSULE | Freq: Two times a day (BID) | ORAL | 0 refills | Status: DC

## 2020-10-04 NOTE — ED Triage Notes (Signed)
Pt in with c/o abscess that she noticed yesterday, but has worsened over night   Pt states she took a sitz bath with some relief yesterday

## 2020-10-04 NOTE — ED Provider Notes (Signed)
[facility]    CSN: [id number] Arrival date & time: 10/04/20  0845      History   Chief Complaint Chief Complaint  Patient presents with  . Abscess    HPI Ariel Sharp is a 35 y.o. female.   Patient presenting today with 1 day history of left labial abscess.  States it started out as a pea-sized yesterday, has grown to the size of a golf ball at this time.  Significant pain, swelling, redness in the area.  No fever, chills, sweats, vaginal symptoms otherwise.  States she tends to get recurrent abscesses in this area.  Has been soaking in warm bathtub with minimal relief thus far.     Past Medical History:  Diagnosis Date  . Preterm labor     Patient Active Problem List   Diagnosis Date Noted  . Encounter for insertion of intrauterine contraceptive device (IUD) 01/09/2018  . Postpartum care and examination 01/09/2018  . Spontaneous vaginal delivery 12/04/2017  . GBS (group B Streptococcus carrier), +RV culture, currently pregnant 12/01/2017  . 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 Gestational diabetes 10/02/2017  . 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 Supervision of high risk pregnancy, antepartum 06/12/2017    Past Surgical History:  Procedure Laterality Date  . NO PAST SURGERIES      OB History    Gravida  2   Para  2   Term  2   Preterm  0   AB  0   Living  2     SAB      IAB      Ectopic      Multiple  0   Live Births  2            Home Medications    Prior to Admission medications   Medication Sig Start Date End Date Taking? Authorizing Provider  cephALEXin (KEFLEX) 500 MG capsule Take 1 capsule (500 mg total) by mouth 2 (two) times daily. 10/04/20  Yes Particia Nearing, PA-C  Prenatal Vit-Fe Fumarate-FA (PRENATAL PO) Take by mouth.    [provider]    Family History Family History  Problem Relation Age of Onset  . Cancer Mother   . Diabetes Maternal Aunt   . Diabetes  Maternal Grandmother     Social History Social History   Tobacco Use  . Smoking status: Former Smoker    Packs/day: 0.50    Quit date: 05/05/2017    Years since quitting: 3.4  . Smokeless tobacco: Never Used  Substance Use Topics  . Alcohol use: No  . Drug use: Yes    Types: Marijuana    Comment: Previous     Allergies   Shrimp [shellfish allergy]   Review of Systems Review of Systems Per HPI  Physical Exam Triage Vital Signs ED Triage Vitals  Enc Vitals Group     BP 10/04/20 0924 115/81     Pulse Rate 10/04/20 0924 (!) 103     Resp 10/04/20 0924 19     Temp 10/04/20 0924 98.4 F (36.9 C)     Temp Source 10/04/20 0924 Oral     SpO2 10/04/20 0924 98 %     Weight --      Height --      Head Circumference --      Peak Flow --      Pain Score 10/04/20 0922 8     Pain Loc --  Pain Edu? --      Excl. in GC? --    No data found.  Updated Vital Signs BP 115/81 (BP Location: Left Arm)   Pulse (!) 103   Temp 98.4 F (36.9 C) (Oral)   Resp 19   SpO2 98%   Breastfeeding No   Visual Acuity Right Eye Distance:   Left Eye Distance:   Bilateral Distance:    Right Eye Near:   Left Eye Near:    Bilateral Near:     Physical Exam Vitals and nursing note reviewed. Exam conducted with a chaperone present.  Constitutional:      Appearance: Normal appearance. She is not ill-appearing.  HENT:     Head: Atraumatic.     Mouth/Throat:     Mouth: Mucous membranes are moist.     Pharynx: Oropharynx is clear.  Eyes:     Extraocular Movements: Extraocular movements intact.     Conjunctiva/sclera: Conjunctivae normal.  Cardiovascular:     Rate and Rhythm: Normal rate and regular rhythm.     Heart sounds: Normal heart sounds.  Pulmonary:     Effort: Pulmonary effort is normal.     Breath sounds: Normal breath sounds. No wheezing or rales.  Genitourinary:    Comments: Significant erythema, edema of left labia minora, spontaneously draining slow stream of brown  purulent discharge Musculoskeletal:        General: Normal range of motion.     Cervical back: Normal range of motion and neck supple.  Skin:    General: Skin is warm and dry.     Findings: No erythema or rash.  Neurological:     Mental Status: She is alert and oriented to person, place, and time.  Psychiatric:        Mood and Affect: Mood normal.        Thought Content: Thought content normal.        Judgment: Judgment normal.      UC Treatments / Results  Labs (all labs ordered are listed, but only abnormal results are displayed) Labs Reviewed - No data to display  EKG   Radiology No results found.  Procedures Procedures (including critical care time)  Medications Ordered in UC Medications - No data to display  Initial Impression / Assessment and Plan / UC Course  I have reviewed the triage vital signs and the nursing notes.  Pertinent labs & imaging results that were available during my care of the patient were reviewed by me and considered in my medical decision making (see chart for details).     Abscess started spontaneously draining in the waiting room, will continue warm Epsom salt soaks, start Keflex course, good wound care reviewed.  Return for acutely worsening symptoms or unresolving symptoms.  Final Clinical Impressions(s) / UC Diagnoses   Final diagnoses:  Left genital labial abscess   Discharge Instructions   None    ED Prescriptions    Medication Sig Dispense Auth. Provider   cephALEXin (KEFLEX) 500 MG capsule Take 1 capsule (500 mg total) by mouth 2 (two) times daily. 14 capsule Particia Nearing, [place]     PDMP not reviewed this encounter.   Roosvelt Maser [place], [place] 10/04/20 [phone]

## 2024-05-19 ENCOUNTER — Encounter (HOSPITAL_COMMUNITY): Payer: Self-pay

## 2024-05-19 ENCOUNTER — Ambulatory Visit (HOSPITAL_COMMUNITY)
Admission: EM | Admit: 2024-05-19 | Discharge: 2024-05-19 | Disposition: A | Discharge status: Home / Self Care | Source: Home / Self Care | Attending: Family Medicine | Admitting: Family Medicine

## 2024-05-19 DIAGNOSIS — L0231 Cutaneous abscess of buttock: Secondary | ICD-10-CM | POA: Insufficient documentation

## 2024-05-19 LAB — POCT URINE DIPSTICK
Glucose, UA: NEGATIVE mg/dL
Leukocytes, UA: NEGATIVE
Nitrite, UA: NEGATIVE
POC PROTEIN,UA: 100 — AB
Spec Grav, UA: 1.02
Urobilinogen, UA: 1 U/dL
pH, UA: 6

## 2024-05-19 LAB — POCT URINE PREGNANCY: Preg Test, Ur: NEGATIVE

## 2024-05-19 MED ORDER — HYDROCODONE-ACETAMINOPHEN 5-325 MG PO TABS
ORAL_TABLET | ORAL | Status: AC
  Filled 2024-05-19: qty 1

## 2024-05-19 MED ORDER — HYDROCODONE-ACETAMINOPHEN 5-325 MG PO TABS
1.0000 | ORAL_TABLET | Freq: Once | ORAL | Status: AC
  Administered 2024-05-19: 1 via ORAL

## 2024-05-19 MED ORDER — DOXYCYCLINE HYCLATE 100 MG PO CAPS: 100.0000 mg | ORAL_CAPSULE | Freq: Two times a day (BID) | ORAL | 0 refills | Status: AC

## 2024-05-19 MED ORDER — LIDOCAINE HCL 2 % IJ SOLN
INTRAMUSCULAR | Status: AC
  Filled 2024-05-19: qty 20

## 2024-05-19 MED ORDER — OXYCODONE-ACETAMINOPHEN 5-325 MG PO TABS: 1.0000 | ORAL_TABLET | Freq: Four times a day (QID) | ORAL | 0 refills | Status: AC | PRN

## 2024-05-19 NOTE — ED Triage Notes (Signed)
 PT states vaginal odot and discomfort after having sexual intercourse several days ago. Patient c/o abscess near her rectum x 2 days.

## 2024-05-19 NOTE — Discharge Instructions (Signed)
 Be aware, you have been prescribed pain medications that may cause drowsiness. While taking this medication, do not take any other medications containing acetaminophen (Tylenol). Do not combine with alcohol or recreational drugs. Please do not drive, operate heavy machinery, or take part in activities that require making important decisions while on this medication as your judgement may be clouded.

## 2024-05-19 NOTE — ED Notes (Signed)
 Stressed to patient not to drive on pain medicine, to follow precautionary statements related to the use of pain medicine.    Reviewed work note with patient

## 2024-05-20 ENCOUNTER — Ambulatory Visit (HOSPITAL_COMMUNITY): Payer: Self-pay

## 2024-05-20 LAB — CERVICOVAGINAL ANCILLARY ONLY
Bacterial Vaginitis (gardnerella): POSITIVE — AB
Candida Glabrata: NEGATIVE
Candida Vaginitis: NEGATIVE
Chlamydia: NEGATIVE
Comment: NEGATIVE
Comment: NEGATIVE
Comment: NEGATIVE
Comment: NEGATIVE
Comment: NEGATIVE
Comment: NORMAL
Neisseria Gonorrhea: NEGATIVE
Trichomonas: NEGATIVE

## 2024-05-20 MED ORDER — METRONIDAZOLE 500 MG PO TABS: 500.0000 mg | ORAL_TABLET | Freq: Two times a day (BID) | ORAL | 0 refills | Status: AC

## 2024-05-21 ENCOUNTER — Ambulatory Visit (HOSPITAL_COMMUNITY): Admission: EM | Admit: 2024-05-21 | Discharge: 2024-05-21 | Disposition: A | Discharge status: Home / Self Care

## 2024-05-21 ENCOUNTER — Emergency Department (HOSPITAL_BASED_OUTPATIENT_CLINIC_OR_DEPARTMENT_OTHER)

## 2024-05-21 ENCOUNTER — Encounter (HOSPITAL_COMMUNITY): Payer: Self-pay

## 2024-05-21 ENCOUNTER — Other Ambulatory Visit: Payer: Self-pay

## 2024-05-21 ENCOUNTER — Emergency Department (HOSPITAL_BASED_OUTPATIENT_CLINIC_OR_DEPARTMENT_OTHER)
Admission: EM | Admit: 2024-05-21 | Discharge: 2024-05-21 | Disposition: A | Discharge status: Home / Self Care | Attending: Emergency Medicine | Admitting: Emergency Medicine

## 2024-05-21 DIAGNOSIS — E876 Hypokalemia: Secondary | ICD-10-CM | POA: Insufficient documentation

## 2024-05-21 DIAGNOSIS — Z5189 Encounter for other specified aftercare: Secondary | ICD-10-CM

## 2024-05-21 DIAGNOSIS — K130 Diseases of lips: Secondary | ICD-10-CM

## 2024-05-21 DIAGNOSIS — R102 Pelvic and perineal pain unspecified side: Secondary | ICD-10-CM

## 2024-05-21 DIAGNOSIS — R1031 Right lower quadrant pain: Secondary | ICD-10-CM

## 2024-05-21 LAB — URINALYSIS, ROUTINE W REFLEX MICROSCOPIC
Bacteria, UA: NONE SEEN
Bilirubin Urine: NEGATIVE
Glucose, UA: NEGATIVE mg/dL
Ketones, ur: 15 mg/dL — AB
Leukocytes,Ua: NEGATIVE
Nitrite: NEGATIVE
Protein, ur: 30 mg/dL — AB
Specific Gravity, Urine: 1.032 — ABNORMAL HIGH (ref 1.005–1.030)
pH: 6 (ref 5.0–8.0)

## 2024-05-21 LAB — COMPREHENSIVE METABOLIC PANEL WITH GFR
ALT: 6 U/L (ref 0–44)
AST: 14 U/L — ABNORMAL LOW (ref 15–41)
Albumin: 4.3 g/dL (ref 3.5–5.0)
Alkaline Phosphatase: 49 U/L (ref 38–126)
Anion gap: 15 (ref 5–15)
BUN: 12 mg/dL (ref 6–20)
CO2: 22 mmol/L (ref 22–32)
Calcium: 9.5 mg/dL (ref 8.9–10.3)
Chloride: 102 mmol/L (ref 98–111)
Creatinine, Ser: 0.62 mg/dL (ref 0.44–1.00)
GFR, Estimated: >60 mL/min
Glucose, Bld: 138 mg/dL — ABNORMAL HIGH (ref 70–99)
Potassium: 3.4 mmol/L — ABNORMAL LOW (ref 3.5–5.1)
Sodium: 139 mmol/L (ref 135–145)
Total Bilirubin: 0.7 mg/dL (ref 0.0–1.2)
Total Protein: 7.8 g/dL (ref 6.5–8.1)

## 2024-05-21 LAB — CBC WITH DIFFERENTIAL/PLATELET
Abs Immature Granulocytes: 0.03 K/uL (ref 0.00–0.07)
Basophils Absolute: 0.1 K/uL (ref 0.0–0.1)
Basophils Relative: 1 %
Eosinophils Absolute: 0.1 K/uL (ref 0.0–0.5)
Eosinophils Relative: 1 %
HCT: 39.7 % (ref 36.0–46.0)
Hemoglobin: 13.4 g/dL (ref 12.0–15.0)
Immature Granulocytes: 0 %
Lymphocytes Relative: 24 %
Lymphs Abs: 2.3 K/uL (ref 0.7–4.0)
MCH: 31.5 pg (ref 26.0–34.0)
MCHC: 33.8 g/dL (ref 30.0–36.0)
MCV: 93.4 fL (ref 80.0–100.0)
Monocytes Absolute: 0.6 K/uL (ref 0.1–1.0)
Monocytes Relative: 6 %
Neutro Abs: 6.8 K/uL (ref 1.7–7.7)
Neutrophils Relative %: 68 %
Platelets: 307 K/uL (ref 150–400)
RBC: 4.25 MIL/uL (ref 3.87–5.11)
RDW: 13.4 % (ref 11.5–15.5)
WBC: 9.9 K/uL (ref 4.0–10.5)
nRBC: 0 % (ref 0.0–0.2)

## 2024-05-21 LAB — POCT URINE DIPSTICK
Glucose, UA: NEGATIVE mg/dL
Leukocytes, UA: NEGATIVE
Nitrite, UA: NEGATIVE
POC PROTEIN,UA: 30 — AB
Spec Grav, UA: 1.025
Urobilinogen, UA: 1 U/dL
pH, UA: 5.5

## 2024-05-21 LAB — PREGNANCY, URINE: Preg Test, Ur: NEGATIVE

## 2024-05-21 LAB — LACTIC ACID, PLASMA: Lactic Acid, Venous: 1.8 mmol/L (ref 0.5–1.9)

## 2024-05-21 LAB — LIPASE, BLOOD: Lipase: 14 U/L (ref 11–51)

## 2024-05-21 MED ORDER — ACYCLOVIR 5 % EX CREA: 1.0000 | TOPICAL_CREAM | CUTANEOUS | 0 refills | Status: DC

## 2024-05-21 MED ORDER — IOHEXOL 300 MG/ML  SOLN
100.0000 mL | Freq: Once | INTRAMUSCULAR | Status: AC | PRN
  Administered 2024-05-21: 80 mL via INTRAVENOUS

## 2024-05-21 NOTE — ED Provider Notes (Signed)
 " [facility]  Note:  This document was prepared using Dragon voice recognition software and may include unintentional dictation errors.  MRN: [id number] DOB: 02-01-1986  Subjective:   IVON Sharp is a 39 y.o. female presenting for reevaluation of previously I indeed rectal versus pilonidal abscess.  Patient reports that she was seen on Wednesday and had abscess drained here in urgent care.  Patient has packing in place and was told to come back in 2 to 3 days for packing removal and reassessment.  Patient is currently taking doxycycline  as prescribed and states that pressure and pain is much better than it was on Wednesday.  Patient also reports that she has right upper lip lesion/sore that popped up on Thursday.  Patient denies any past history of cold sores or HSV.  Denies any known exposure to HSV.  Patient also concerned for lower abdominal pain since Sunday.  Patient reports that pain is in the right lower quadrant and is occasionally very sharp stabbing pain to the right lower quadrant abdomen.  Patient denies any nausea or vomiting, no diarrhea, no recent trauma or injury to the abdomen.  Patient denies any recent fever, weakness, dizziness, shortness of breath, chest pain.  Current Medications[1]   Allergies[2]  Past Medical History:  Diagnosis Date   Preterm labor      Past Surgical History:  Procedure Laterality Date   NO PAST SURGERIES      Family History  Problem Relation Age of Onset   Cancer Mother    Diabetes Maternal Grandmother    Diabetes Maternal Aunt     Social History[3]  ROS Refer to HPI for ROS details.  Objective:    Vitals: BP 132/87 (BP Location: Right Arm)   Pulse (!) 123   Temp 98.2 F (36.8 C) (Oral)   Resp 16   SpO2 97%   Physical Exam Vitals and nursing note reviewed.  Constitutional:      General: She is not in acute distress.    Appearance: She is well-developed. She is not ill-appearing or toxic-appearing.   HENT:     Head: Normocephalic and atraumatic.     Mouth/Throat:     Mouth: Mucous membranes are moist.  Cardiovascular:     Rate and Rhythm: Normal rate.  Pulmonary:     Effort: Pulmonary effort is normal. No respiratory distress.     Breath sounds: No stridor. No wheezing.  Abdominal:     Palpations: Abdomen is soft.     Tenderness: There is abdominal tenderness in the right lower quadrant and suprapubic area. There is no right CVA tenderness, left CVA tenderness, guarding or rebound. Negative signs include McBurney's sign and psoas sign.  Musculoskeletal:        General: Normal range of motion.  Skin:    General: Skin is warm and dry.  Neurological:     General: No focal deficit present.     Mental Status: She is alert and oriented to person, place, and time.  Psychiatric:        Mood and Affect: Mood normal.        Behavior: Behavior normal.     Procedures  Results for orders placed or performed during the hospital encounter of 05/21/24 (from the past 24 hours)  POC Urinalysis Dipstick     Status: Abnormal   Collection Time: 05/21/24  1:43 PM  Result Value Ref Range   Color, UA other (A) yellow   Clarity, UA turbid (A) clear  Glucose, UA negative negative mg/dL   Bilirubin, UA small (A) negative   Ketones, POC UA small (15) (A) negative mg/dL   Spec Grav, UA 8.974 8.989 - 1.025   Blood, UA moderate (A) negative   pH, UA 5.5 5.0 - 8.0   POC PROTEIN,UA =30 (A) negative, trace   Urobilinogen, UA 1.0 0.2 or 1.0 E.U./dL   Nitrite, UA Negative Negative   Leukocytes, UA Negative Negative    Assessment and Plan :     Discharge Instructions       1. Right lower quadrant abdominal pain (Primary) - POC Urinalysis Dipstick completed in UC shows moderate blood, no leukocytes, no nitrite, patient has small bilirubin and small ketones with 30 protein. - Urine culture collected in UC and sent to lab for further testing results to be available in 2 to 3 days - Based on  right lower quadrant abdominal tenderness with palpation along with sharp stabbing pains intermittently recommend immediate follow-up in emergency department for further evaluation and treatment.  2. Wound check, abscess - Packing removed from rectal abscess.  No secondary signs of spreading infection, no significant swelling or erythema, mild purulent discharge noted from wound after removal of iodoform gauze.  Gauze pad placed over site for protection and collection of any persistent drainage.  3. Lip lesion - HSV 1/2 PCR (Surface) collected in UC and sent to lab for further testing results should be available in 1 to 2 days. - Apply prescribed acyclovir  5% cream to the area 2-3 times daily as needed for treatment of lip lesion.  - Please go to local emergency department after leaving urgent care for further evaluation of right lower abdominal pain.      Ariel Sharp    [1] No current facility-administered medications for this encounter.  Current Outpatient Medications:    acyclovir  cream (ZOVIRAX ) 5 %, Apply 1 Application topically every 3 (three) hours., Disp: 15 g, Rfl: 0   doxycycline  (VIBRAMYCIN ) 100 MG capsule, Take 1 capsule (100 mg total) by mouth 2 (two) times daily., Disp: 14 capsule, Rfl: 0   metroNIDAZOLE  (FLAGYL ) 500 MG tablet, Take 1 tablet (500 mg total) by mouth 2 (two) times daily for 7 days., Disp: 14 tablet, Rfl: 0   oxyCODONE -acetaminophen  (PERCOCET/ROXICET) 5-325 MG tablet, Take 1 tablet by mouth every 6 (six) hours as needed for severe pain (pain score 7-10)., Disp: 10 tablet, Rfl: 0 [2]  Allergies Allergen Reactions   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

## 2024-05-21 NOTE — ED Triage Notes (Addendum)
 Pt presents for recheck of rectal abscess. Pt reports that since Sunday she has only been able to tolerate liquids, has lost 10 lbs since then. Patient denies any vomiting. Lower abdominal aching since Sunday, mostly on (R) side. Pt took 2 doses of Doxycyline and then noticed a sore on (R) upper lip Thursday. Denies any hx of cold sores or recent oral sex. Last BM: 05/14/24. Pt also reports some drainage from the abscess but nothing concerning. Area is still painful.

## 2024-05-21 NOTE — ED Provider Notes (Signed)
 " Ariel Sharp EMERGENCY DEPARTMENT AT [facility] Provider Note   CSN: [id number] Arrival date & time: 05/21/24  1940     Patient presents with: Abdominal Pain and Flank Pain   Ariel Sharp is a 39 y.o. female.   39 year old female presents with right lower quadrant pain for about a week.  States the pain waxes and wanes.  States it is somewhat positional.  It has not been associated with fever or chills in the last 48 hours.  Was seen in urgent care and had I&D of rectal abscess a few days ago.  She was placed on doxycycline .  Went there today for recheck and told her she continued to have discomfort in her right lower quadrant.  Pain is characterized as sharp.  Does not radiate to her flank.  Denies any vaginal bleeding.       Prior to Admission medications  Medication Sig Start Date End Date Taking? Authorizing Provider  acyclovir  cream (ZOVIRAX ) 5 % Apply 1 Application topically every 3 (three) hours. 05/21/24   Reddick, Johnathan B, NP  doxycycline  (VIBRAMYCIN ) 100 MG capsule Take 1 capsule (100 mg total) by mouth 2 (two) times daily. 05/19/24   Rolinda Rogue, MD  metroNIDAZOLE  (FLAGYL ) 500 MG tablet Take 1 tablet (500 mg total) by mouth 2 (two) times daily for 7 days. 05/20/24 05/27/24  Banister, Pamela K, MD  oxyCODONE -acetaminophen  (PERCOCET/ROXICET) 5-325 MG tablet Take 1 tablet by mouth every 6 (six) hours as needed for severe pain (pain score 7-10). 05/19/24   Rolinda Rogue, MD    Allergies: Shrimp [shellfish allergy]    Review of Systems  All other systems reviewed and are negative.   Updated Vital Signs BP 135/78   Pulse (!) 120   Temp 98.1 F (36.7 C) (Oral)   Resp 17   SpO2 100%   Physical Exam Vitals and nursing note reviewed.  Constitutional:      General: She is not in acute distress.    Appearance: Normal appearance. She is well-developed. She is not toxic-appearing.  HENT:     Head: Normocephalic and atraumatic.  Eyes:     General: Lids are  normal.     Conjunctiva/sclera: Conjunctivae normal.     Pupils: Pupils are equal, round, and reactive to light.  Neck:     Thyroid: No thyroid mass.     Trachea: No tracheal deviation.  Cardiovascular:     Rate and Rhythm: Normal rate and regular rhythm.     Heart sounds: Normal heart sounds. No murmur heard.    No gallop.  Pulmonary:     Effort: Pulmonary effort is normal. No respiratory distress.     Breath sounds: Normal breath sounds. No stridor. No decreased breath sounds, wheezing, rhonchi or rales.  Abdominal:     General: There is no distension.     Palpations: Abdomen is soft.     Tenderness: There is no abdominal tenderness. There is no rebound.   Musculoskeletal:        General: No tenderness. Normal range of motion.     Cervical back: Normal range of motion and neck supple.  Skin:    General: Skin is warm and dry.     Findings: No abrasion or rash.  Neurological:     Mental Status: She is alert and oriented to person, place, and time. Mental status is at baseline.     GCS: GCS eye subscore is 4. GCS verbal subscore is 5. GCS motor subscore is 6.  Cranial Nerves: No cranial nerve deficit.     Sensory: No sensory deficit.     Motor: Motor function is intact.  Psychiatric:        Attention and Perception: Attention normal.        Speech: Speech normal.        Behavior: Behavior normal.     (all labs ordered are listed, but only abnormal results are displayed) Labs Reviewed  CBC WITH DIFFERENTIAL/PLATELET  LIPASE, BLOOD  COMPREHENSIVE METABOLIC PANEL WITH GFR  URINALYSIS, ROUTINE W REFLEX MICROSCOPIC  PREGNANCY, URINE  LACTIC ACID, PLASMA  LACTIC ACID, PLASMA    EKG: None  Radiology: No results found.   Procedures   Medications Ordered in the ED - No data to display                                  Medical Decision Making Amount and/or Complexity of Data Reviewed Labs: ordered. Radiology: ordered.  Risk Prescription drug  management.   Patient's urinalysis negative for infection.  Pregnant test negative.  Patient recently had STI testing.  Patient that was negative.  Abdominal CT showed no evidence of appendicitis.  No suspicion for ovarian torsion.  Patient will follow-up with her doctor     Final diagnoses:  None    ED Discharge Orders     None          Dasie Faden, MD 05/21/24 2152  "

## 2024-05-21 NOTE — ED Notes (Signed)
 Reviewed discharge instructions and follow-up care with pt. Pt verbalized understanding and had no further questions. Pt exited ED without complications.

## 2024-05-21 NOTE — ED Triage Notes (Signed)
 Recent I&D of rectal abscess. Also has Bartholin- this has improved since seen. Started on doxycycline -started Wednesday- Flagyl  is scheduled to be picked up for BV- has not started yet. Also has lip lesion pending HSV1/2 PCR. Acyclovir  started.   Is here in the ER for RLQ pain. +N/-V/-D. Reduced appetite. Weight loss. Tachy, afebrile.

## 2024-05-21 NOTE — ED Notes (Signed)
 Patient transported to CT

## 2024-05-21 NOTE — Discharge Instructions (Addendum)
" °  1. Right lower quadrant abdominal pain (Primary) - POC Urinalysis Dipstick completed in UC shows moderate blood, no leukocytes, no nitrite, patient has small bilirubin and small ketones with 30 protein. - Urine culture collected in UC and sent to lab for further testing results to be available in 2 to 3 days - Based on right lower quadrant abdominal tenderness with palpation along with sharp stabbing pains intermittently recommend immediate follow-up in emergency department for further evaluation and treatment.  2. Wound check, abscess - Packing removed from rectal abscess.  No secondary signs of spreading infection, no significant swelling or erythema, mild purulent discharge noted from wound after removal of iodoform gauze.  Gauze pad placed over site for protection and collection of any persistent drainage.  3. Lip lesion - HSV 1/2 PCR (Surface) collected in UC and sent to lab for further testing results should be available in 1 to 2 days. - Apply prescribed acyclovir  5% cream to the area 2-3 times daily as needed for treatment of lip lesion.  - Please go to local emergency department after leaving urgent care for further evaluation of right lower abdominal pain. "

## 2024-05-22 LAB — HSV 1/2 PCR (SURFACE)
HSV-1 DNA: DETECTED — AB
HSV-2 DNA: NOT DETECTED

## 2024-05-22 LAB — URINE CULTURE
Culture: NO GROWTH
Special Requests: NORMAL

## 2024-05-22 NOTE — ED Provider Notes (Signed)
 " [facility]   [id number] 05/19/24 Arrival Time: 1915  ASSESSMENT & PLAN:  1. Abscess of gluteal region     Incision and Drainage Procedure Note  Anesthesia: 2% plain lidocaine   Procedure Details  The procedure, risks and complications have been discussed in detail (including damage to surrounding structures, pain, and bleeding) with the patient.  The skin induration was prepped and draped in the usual fashion. After adequate local anesthesia, I&D with a #11 blade was performed on the midline gluteal cleft approx 3 cm above rectum with copious, bloody, purulent drainage. Pt reports pain relief s/p drainage.  EBL: minimal Drains: none Packing: 1/4 iodoform Condition: Tolerated procedure well Complications: none.  Meds ordered this encounter  Medications   HYDROcodone -acetaminophen  (NORCO/VICODIN) 5-325 MG per tablet 1 tablet    Refill:  0   doxycycline  (VIBRAMYCIN ) 100 MG capsule    Sig: Take 1 capsule (100 mg total) by mouth 2 (two) times daily.    Dispense:  14 capsule    Refill:  0   oxyCODONE -acetaminophen  (PERCOCET/ROXICET) 5-325 MG tablet    Sig: Take 1 tablet by mouth every 6 (six) hours as needed for severe pain (pain score 7-10).    Dispense:  10 tablet    Refill:  0    Wound care instructions discussed and given in written format. To return in 48 hours for wound check.  Finish all antibiotics. Vaginal cytology pending.  Reviewed expectations re: course of current medical issues. Questions answered. Outlined signs and symptoms indicating need for more acute intervention. Patient verbalized understanding. After Visit Summary given.   SUBJECTIVE:  Ariel Sharp is a 39 y.o. female who presents with a possible abscess of gluteal region/above rectum; x 24-48 hours; very painful. Denies fever/drainage. Also with some vaginal discharge.  OBJECTIVE:  Vitals:   05/19/24 1950  BP: 128/72  Pulse: (!) 117  Resp: 18  Temp: 98.2 F (36.8 C)   TempSrc: Oral  SpO2: 99%    Tachycardia noted.  General appearance: alert; no distress GU: (RN chaperone present for exam and procedure) approx 2x3 cm induration of her gluteal cleft approx 2-3 cm above rectum; very tender to touch; no active drainage or bleeding Psychological: alert and cooperative; normal mood and affect  Allergies[1]  Past Medical History:  Diagnosis Date   Preterm labor    Social History   Socioeconomic History   Marital status: Single    Spouse name: Not on file   Number of children: Not on file   Years of education: Not on file   Highest education level: Not on file  Occupational History   Not on file  Tobacco Use   Smoking status: Former    Current packs/day: 0.00    Average packs/day: 0.5 packs/day    Types: Cigarettes    Quit date: 05/05/2017    Years since quitting: 7.0   Smokeless tobacco: Never  Vaping Use   Vaping status: Never Used  Substance and Sexual Activity   Alcohol use: No   Drug use: Yes    Types: Marijuana    Comment: Previous   Sexual activity: Never    Birth control/protection: I.U.D.  Other Topics Concern   Not on file  Social History Narrative   Not on file   Social Drivers of Health   Tobacco Use: Medium Risk (05/21/2024)   Patient History    Smoking Tobacco Use: Former    Smokeless Tobacco Use: Never    Passive Exposure: Not on file  Financial Resource Strain: Not on file  Food Insecurity: Not on file  Transportation Needs: Not on file  Physical Activity: Not on file  Stress: Not on file  Social Connections: Not on file  Depression (EYV7-0): Not on file  Alcohol Screen: Not on file  Housing: Not on file  Utilities: Not on file  Health Literacy: Not on file   Family History  Problem Relation Age of Onset   Cancer Mother    Diabetes Maternal Grandmother    Diabetes Maternal Aunt    Past Surgical History:  Procedure Laterality Date   NO PAST SURGERIES               [1]  Allergies Allergen  Reactions   Shrimp [Shellfish Allergy] Anaphylaxis     Rolinda Rogue, MD 05/22/24 1008  "

## 2024-05-24 ENCOUNTER — Ambulatory Visit (HOSPITAL_COMMUNITY): Payer: Self-pay

## 2024-06-03 ENCOUNTER — Telehealth (HOSPITAL_COMMUNITY): Payer: Self-pay

## 2024-06-03 MED ORDER — ACYCLOVIR 5 % EX CREA: 1.0000 | TOPICAL_CREAM | CUTANEOUS | 0 refills | Status: AC

## 2024-06-03 MED ORDER — FLUCONAZOLE 150 MG PO TABS: 150.0000 mg | ORAL_TABLET | Freq: Once | ORAL | 0 refills | Status: AC

## 2024-06-03 NOTE — Telephone Encounter (Signed)
 Pt reports pharmacy was unable to fill meds. Requests resent. Assisted with PCP placement.

## 2024-07-13 ENCOUNTER — Ambulatory Visit: Admitting: Internal Medicine
